# Patient Record
Sex: Male | Born: 1955 | Race: White | Hispanic: No | Marital: Married | Smoking: Former smoker
Health system: Southern US, Community
[De-identification: ages and names within clinical notes are randomized; demographics above are authoritative.]

## PROBLEM LIST (undated history)

## (undated) DIAGNOSIS — I4891 Unspecified atrial fibrillation: Secondary | ICD-10-CM

## (undated) DIAGNOSIS — I10 Essential (primary) hypertension: Secondary | ICD-10-CM

## (undated) DIAGNOSIS — M109 Gout, unspecified: Secondary | ICD-10-CM

## (undated) HISTORY — PX: HERNIA REPAIR: SHX51

## (undated) HISTORY — DX: Unspecified atrial fibrillation: I48.91

## (undated) HISTORY — PX: DERMOID CYST  EXCISION: SHX1452

## (undated) HISTORY — PX: TONSILLECTOMY: SUR1361

## (undated) HISTORY — DX: Essential (primary) hypertension: I10

---

## 2006-09-04 ENCOUNTER — Ambulatory Visit: Admission: RE | Admit: 2006-09-04 | Discharge: 2006-09-04 | Payer: Self-pay | Admitting: *Deleted

## 2011-05-31 ENCOUNTER — Other Ambulatory Visit: Payer: Self-pay | Admitting: *Deleted

## 2011-05-31 NOTE — Telephone Encounter (Signed)
Deny refill per pt wants filled by dr little who is regulating his bp

## 2012-03-13 ENCOUNTER — Encounter: Payer: Self-pay | Admitting: *Deleted

## 2012-03-13 DIAGNOSIS — Z9089 Acquired absence of other organs: Secondary | ICD-10-CM | POA: Insufficient documentation

## 2012-07-28 ENCOUNTER — Emergency Department (HOSPITAL_COMMUNITY): Payer: BC Managed Care – PPO

## 2012-07-28 ENCOUNTER — Emergency Department (HOSPITAL_COMMUNITY)
Admission: EM | Admit: 2012-07-28 | Discharge: 2012-07-28 | Disposition: A | Discharge status: Home / Self Care | Payer: BC Managed Care – PPO | Attending: Emergency Medicine | Admitting: Emergency Medicine

## 2012-07-28 ENCOUNTER — Encounter (HOSPITAL_COMMUNITY): Payer: Self-pay | Admitting: *Deleted

## 2012-07-28 DIAGNOSIS — I4891 Unspecified atrial fibrillation: Secondary | ICD-10-CM | POA: Insufficient documentation

## 2012-07-28 DIAGNOSIS — Y998 Other external cause status: Secondary | ICD-10-CM | POA: Insufficient documentation

## 2012-07-28 DIAGNOSIS — S83003A Unspecified subluxation of unspecified patella, initial encounter: Secondary | ICD-10-CM

## 2012-07-28 DIAGNOSIS — W19XXXA Unspecified fall, initial encounter: Secondary | ICD-10-CM | POA: Insufficient documentation

## 2012-07-28 DIAGNOSIS — S86819A Strain of other muscle(s) and tendon(s) at lower leg level, unspecified leg, initial encounter: Secondary | ICD-10-CM

## 2012-07-28 DIAGNOSIS — M2390 Unspecified internal derangement of unspecified knee: Secondary | ICD-10-CM | POA: Insufficient documentation

## 2012-07-28 DIAGNOSIS — S83006A Unspecified dislocation of unspecified patella, initial encounter: Secondary | ICD-10-CM | POA: Insufficient documentation

## 2012-07-28 DIAGNOSIS — I10 Essential (primary) hypertension: Secondary | ICD-10-CM | POA: Insufficient documentation

## 2012-07-28 DIAGNOSIS — Y92009 Unspecified place in unspecified non-institutional (private) residence as the place of occurrence of the external cause: Secondary | ICD-10-CM | POA: Insufficient documentation

## 2012-07-28 DIAGNOSIS — Y9301 Activity, walking, marching and hiking: Secondary | ICD-10-CM | POA: Insufficient documentation

## 2012-07-28 DIAGNOSIS — Z87891 Personal history of nicotine dependence: Secondary | ICD-10-CM | POA: Insufficient documentation

## 2012-07-28 MED ORDER — IBUPROFEN 800 MG PO TABS
800.0000 mg | ORAL_TABLET | Freq: Once | ORAL | Status: AC
  Administered 2012-07-28: 800 mg via ORAL
  Filled 2012-07-28: qty 1

## 2012-07-28 NOTE — ED Notes (Signed)
Patient transported to X-ray 

## 2012-07-28 NOTE — ED Notes (Signed)
Pt states he slipped and fell and injured R knee at 1500. Pt states he is unable to bear wt. States knee "gives out" when he tries to stand on it. Pt arrives with ice pack on knee. Pt in wheelchair to treatment room.

## 2012-07-28 NOTE — ED Notes (Signed)
Ortho tech at bedside for application of knee immobilizer and crutches fitting.

## 2012-07-28 NOTE — ED Provider Notes (Signed)
History     CSN: [id number]  Arrival date & time 07/28/12  1608   First MD Initiated Contact with Patient 07/28/12 1709      Chief Complaint  Patient presents with  . Knee Injury    rt     (Consider location/radiation/quality/duration/timing/severity/associated sxs/prior treatment) HPI Comments: 56 year old male presents to the emergency department with right knee pain after walking through his yard and falling onto his right knee around 3:00 this afternoon. He states he was trying to avoid walking into a spiderweb when his knees gave out and he fell. Denies hitting his head or any loss of consciousness. He came directly to the emergency department. He states his pain is not bad at rest, however with movement or trying to walk it as a 7/10. He has not tried any alleviating factors. Denies any numbness or tingling down his leg.  The history is provided by the patient.    Past Medical History  Diagnosis Date  . Atrial fibrillation   . Hypertension   . Hx of tonsillectomy     Past Surgical History  Procedure Date  . Hernia repair     Family History  Problem Relation Age of Onset  . Heart disease Father   . Hypertension Father     History  Substance Use Topics  . Smoking status: Former Games developer  . Smokeless tobacco: Not on file  . Alcohol Use: Yes      Review of Systems  Musculoskeletal: Positive for joint swelling, arthralgias (right knee pain) and gait problem.  Skin: Negative for color change and wound.  Neurological: Negative for numbness.  Psychiatric/Behavioral: Negative for confusion.    Allergies  Review of patient's allergies indicates no known allergies.  Home Medications   Current Outpatient Rx  Name Route Sig Dispense Refill  . ALLOPURINOL 300 MG PO TABS Oral Take 300 mg by mouth daily.    Marland Kitchen AMLODIPINE BESYLATE 10 MG PO TABS Oral Take 10 mg by mouth daily.    . ASPIRIN 325 MG PO TABS Oral Take 325 mg by mouth daily.    Marland Kitchen  LISINOPRIL-HYDROCHLOROTHIAZIDE 20-25 MG PO TABS Oral Take 1 tablet by mouth daily.    Marland Kitchen METOPROLOL SUCCINATE ER 25 MG PO TB24 Oral Take 25 mg by mouth daily.    Marland Kitchen PROPAFENONE HCL 225 MG PO TABS Oral Take 225 mg by mouth every 8 (eight) hours.      BP 140/82  Pulse 84  Temp 98.2 F (36.8 C) (Oral)  Resp 23  SpO2 99%  Physical Exam  Nursing note and vitals reviewed. Constitutional: He is oriented to person, place, and time. He appears well-developed and well-nourished. No distress.  HENT:  Head: Normocephalic and atraumatic.  Eyes: Conjunctivae normal are normal.  Neck: Normal range of motion.  Cardiovascular: Normal rate, regular rhythm, normal heart sounds and intact distal pulses.   Pulmonary/Chest: Effort normal and breath sounds normal.  Musculoskeletal:       Right knee: He exhibits decreased range of motion, swelling, effusion, deformity (patella alta) and abnormal patellar mobility. tenderness found.       Obvious disruption of patella tendon. Unable to extend knee on his own.  Neurological: He is alert and oriented to person, place, and time. No sensory deficit.  Skin: Skin is warm, dry and intact.  Psychiatric: He has a normal mood and affect. His behavior is normal.    ED Course  Procedures (including critical care time)  Labs Reviewed - No data to  display Dg Knee Complete 4 Views Right  07/28/2012  *RADIOLOGY REPORT*  Clinical Data: Knee injury  RIGHT KNEE - COMPLETE 4+ VIEW  Comparison: None.  Findings: Four views of the right knee submitted.  No acute fracture.  There is superolateral position of the patella consistent with superolateral subluxation.  Patellar tendon injury cannot be excluded.  Clinical correlation is necessary.  IMPRESSION: No acute fracture.  There is superolateral position of the patella consistent with superolateral subluxation.  Patellar tendon injury cannot be excluded.  Clinical correlation is necessary.   Original Report Authenticated By: Natasha Mead, M.D.      1. Patellar subluxation   2. 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 Spoke with Dr. Ophelia Charter who advised to apply a knee immobilizer and give crutches, and he will see the patient in his office on Tuesday. Case discussed with Dr. Effie Shy who also evaluated the patient and agrees with plan of care.        Trevor Mace, PA-C 07/28/12 [phone]

## 2012-07-30 NOTE — ED Provider Notes (Signed)
Pt seen and evaluated. He cannot extend the knee. Moderate anterior swelling over patellar insertion on tibia site.   Medical screening examination/treatment/procedure(s) were conducted as a shared visit with non-physician practitioner(s) and myself.  I personally evaluated the patient during the encounter  Flint Melter, MD 07/30/12 [phone]

## 2012-07-31 ENCOUNTER — Other Ambulatory Visit (HOSPITAL_COMMUNITY): Payer: Self-pay | Admitting: Orthopaedic Surgery

## 2012-07-31 ENCOUNTER — Encounter (HOSPITAL_COMMUNITY)
Admission: RE | Admit: 2012-07-31 | Discharge: 2012-07-31 | Disposition: A | Discharge status: Home / Self Care | Payer: BC Managed Care – PPO | Source: Ambulatory Visit | Attending: Orthopaedic Surgery | Admitting: Orthopaedic Surgery

## 2012-07-31 ENCOUNTER — Encounter (HOSPITAL_COMMUNITY): Payer: Self-pay

## 2012-07-31 ENCOUNTER — Ambulatory Visit (HOSPITAL_COMMUNITY)
Admission: RE | Admit: 2012-07-31 | Discharge: 2012-07-31 | Disposition: A | Discharge status: Home / Self Care | Payer: BC Managed Care – PPO | Source: Ambulatory Visit | Attending: Orthopaedic Surgery | Admitting: Orthopaedic Surgery

## 2012-07-31 DIAGNOSIS — Z01812 Encounter for preprocedural laboratory examination: Secondary | ICD-10-CM | POA: Insufficient documentation

## 2012-07-31 DIAGNOSIS — Z01810 Encounter for preprocedural cardiovascular examination: Secondary | ICD-10-CM | POA: Insufficient documentation

## 2012-07-31 DIAGNOSIS — Z01818 Encounter for other preprocedural examination: Secondary | ICD-10-CM | POA: Insufficient documentation

## 2012-07-31 HISTORY — DX: Gout, unspecified: M10.9

## 2012-07-31 LAB — COMPREHENSIVE METABOLIC PANEL
Albumin: 4 g/dL (ref 3.5–5.2)
BUN: 22 mg/dL (ref 6–23)
CO2: 29 mEq/L (ref 19–32)
Chloride: 101 mEq/L (ref 96–112)
Creatinine, Ser: 1.33 mg/dL (ref 0.50–1.35)
GFR calc Af Amer: 67 mL/min — ABNORMAL LOW (ref >90)
GFR calc non Af Amer: 58 mL/min — ABNORMAL LOW (ref >90)
Glucose, Bld: 137 mg/dL — ABNORMAL HIGH (ref 70–99)
Total Bilirubin: 0.6 mg/dL (ref 0.3–1.2)

## 2012-07-31 LAB — CBC
HCT: 49.2 % (ref 39.0–52.0)
Hemoglobin: 17.4 g/dL — ABNORMAL HIGH (ref 13.0–17.0)
MCV: 95.7 fL (ref 78.0–100.0)
RDW: 12.7 % (ref 11.5–15.5)
WBC: 7.3 10*3/uL (ref 4.0–10.5)

## 2012-07-31 LAB — PROTIME-INR
INR: 0.93 (ref 0.00–1.49)
Prothrombin Time: 12.4 seconds (ref 11.6–15.2)

## 2012-07-31 NOTE — Progress Notes (Addendum)
Requested cardiac records from [facility] and [facility]. Pt dx with A. Fib 3-4 years ago and had workup, saw doctor at [facility], however now does not see cardiologist. Has continued to take metoprolol as prescribed.   Will leave for review by anesthesia.

## 2012-07-31 NOTE — Pre-Procedure Instructions (Signed)
20 Victor Young  07/31/2012   Your procedure is scheduled on:  Monday September 30  Report to [facility] at 8:30 AM.  Call this number if you have problems the morning of surgery: [phone]   Remember:   Do not eat or drink:After Midnight.    Take these medicines the morning of surgery with A SIP OF WATER: Allopurinol (Zyloprim), amlodipine (Norvasc), metoprolol (Toprol), propafenone (Rythmol)   Do not wear jewelry, make-up or nail polish.  Do not wear lotions, powders, or perfumes. You may wear deodorant.  Do not shave 48 hours prior to surgery. Men may shave face and neck.  Do not bring valuables to the hospital.  Contacts, dentures or bridgework may not be worn into surgery.  Leave suitcase in the car. After surgery it may be brought to your room.  For patients admitted to the hospital, checkout time is 11:00 AM the day of discharge.   Patients discharged the day of surgery will not be allowed to drive home.  Name and phone number of your driver: NA  Special Instructions: Shower using CHG 2 nights before surgery and the night before surgery.  If you shower the day of surgery use CHG.  Use special wash - you have one bottle of CHG for all showers.  You should use approximately 1/3 of the bottle for each shower.   Please read over the following fact sheets that you were given: Pain Booklet, Coughing and Deep Breathing and Surgical Site Infection Prevention

## 2012-08-01 NOTE — Consult Note (Signed)
Anesthesia chart review: Patient is a 56 year old male scheduled for right patellar tendon repair on 08/05/2012 by Dr. Ophelia Charter. History includes atrial fibrillation X ~ 4 years, HTN, former smoker, gout, tonsillectomy, hernia repair.  No specified PCP is listed.  He reports he was initially evaluated by a Cardiologist at [facility] (with reported negative stress test) and then by Dr. Lady Deutscher formerly of [facility], but it has been > 3 years ago.  He reports that he had an unsuccessful cardioversion.  He is maintained on B-blocker therapy and ASA.  Currently, his afib is followed by Dr. Catha Gosselin [facility]).  Labs noted.  CXR on 07/31/12 showed no acute cardiopulmonary abnormalities.  EKG on 07/31/12 showed afib @ 92 bpm (new since 09/04/06).  He denies history of chest pain or CHF.  He denies history of cardiac cath and does not remember ever having an echocardiogram.  No cardiac records from [facility] or [facility] (now [facility]) are readily accessible.  I requested any cardiac records from his PCP Dr. Clarene Duke as available.  His afib is chronic and currently rate controlled.  He reports a history of a normal stress test > 3 years ago following his diagnosis of a-fib.  If no significant change in his status then anticipate he can proceed as planned.  Anesthesiologist Dr. Ivin Booty agrees with this plan.  Shonna Chock, PA-C

## 2012-08-04 MED ORDER — CEFAZOLIN SODIUM-DEXTROSE 2-3 GM-% IV SOLR
2.0000 g | INTRAVENOUS | Status: AC
  Administered 2012-08-05: 2 g via INTRAVENOUS

## 2012-08-05 ENCOUNTER — Inpatient Hospital Stay (HOSPITAL_COMMUNITY)
Admission: RE | Admit: 2012-08-05 | Discharge: 2012-08-07 | DRG: 222 | Disposition: A | Discharge status: Home / Self Care | Payer: BC Managed Care – PPO | Source: Ambulatory Visit | Attending: Orthopaedic Surgery | Admitting: Orthopaedic Surgery

## 2012-08-05 ENCOUNTER — Ambulatory Visit (HOSPITAL_COMMUNITY): Payer: BC Managed Care – PPO | Admitting: Vascular Surgery

## 2012-08-05 ENCOUNTER — Encounter (HOSPITAL_COMMUNITY): Payer: Self-pay | Admitting: *Deleted

## 2012-08-05 ENCOUNTER — Encounter (HOSPITAL_COMMUNITY): Payer: Self-pay | Admitting: Vascular Surgery

## 2012-08-05 ENCOUNTER — Encounter (HOSPITAL_COMMUNITY)
Admission: RE | Disposition: A | Discharge status: Home / Self Care | Payer: Self-pay | Source: Ambulatory Visit | Attending: Orthopaedic Surgery

## 2012-08-05 DIAGNOSIS — S838X9A Sprain of other specified parts of unspecified knee, initial encounter: Principal | ICD-10-CM | POA: Diagnosis present

## 2012-08-05 DIAGNOSIS — S86819A Strain of other muscle(s) and tendon(s) at lower leg level, unspecified leg, initial encounter: Secondary | ICD-10-CM | POA: Diagnosis present

## 2012-08-05 DIAGNOSIS — Z79899 Other long term (current) drug therapy: Secondary | ICD-10-CM

## 2012-08-05 DIAGNOSIS — Z23 Encounter for immunization: Secondary | ICD-10-CM

## 2012-08-05 DIAGNOSIS — M25469 Effusion, unspecified knee: Secondary | ICD-10-CM | POA: Diagnosis present

## 2012-08-05 DIAGNOSIS — M109 Gout, unspecified: Secondary | ICD-10-CM | POA: Diagnosis present

## 2012-08-05 DIAGNOSIS — Y929 Unspecified place or not applicable: Secondary | ICD-10-CM

## 2012-08-05 DIAGNOSIS — W19XXXA Unspecified fall, initial encounter: Secondary | ICD-10-CM | POA: Diagnosis present

## 2012-08-05 DIAGNOSIS — Z7982 Long term (current) use of aspirin: Secondary | ICD-10-CM

## 2012-08-05 DIAGNOSIS — Z9089 Acquired absence of other organs: Secondary | ICD-10-CM

## 2012-08-05 DIAGNOSIS — I10 Essential (primary) hypertension: Secondary | ICD-10-CM | POA: Diagnosis present

## 2012-08-05 DIAGNOSIS — I4891 Unspecified atrial fibrillation: Secondary | ICD-10-CM | POA: Diagnosis present

## 2012-08-05 DIAGNOSIS — Z87891 Personal history of nicotine dependence: Secondary | ICD-10-CM

## 2012-08-05 HISTORY — PX: PATELLAR TENDON REPAIR: SHX737

## 2012-08-05 SURGERY — REPAIR, TENDON, PATELLAR
Anesthesia: General | Site: Leg Lower | Laterality: Right | Wound class: Clean

## 2012-08-05 MED ORDER — ONDANSETRON HCL 4 MG PO TABS: 4.0000 mg | ORAL_TABLET | Freq: Four times a day (QID) | ORAL | Status: DC | PRN

## 2012-08-05 MED ORDER — 0.9 % SODIUM CHLORIDE (POUR BTL) OPTIME
TOPICAL | Status: DC | PRN
  Administered 2012-08-05: 1000 mL

## 2012-08-05 MED ORDER — LACTATED RINGERS IV SOLN
INTRAVENOUS | Status: DC
  Administered 2012-08-05: 11:00:00 via INTRAVENOUS

## 2012-08-05 MED ORDER — LISINOPRIL 20 MG PO TABS
20.0000 mg | ORAL_TABLET | Freq: Every day | ORAL | Status: DC
  Administered 2012-08-06 – 2012-08-07 (×2): 20 mg via ORAL
  Filled 2012-08-05 (×2): qty 1

## 2012-08-05 MED ORDER — MIDAZOLAM HCL 2 MG/2ML IJ SOLN
1.0000 mg | INTRAMUSCULAR | Status: DC | PRN
  Administered 2012-08-05: 2 mg via INTRAVENOUS

## 2012-08-05 MED ORDER — ALLOPURINOL 300 MG PO TABS
300.0000 mg | ORAL_TABLET | Freq: Every day | ORAL | Status: DC
  Administered 2012-08-05 – 2012-08-07 (×3): 300 mg via ORAL
  Filled 2012-08-05 (×3): qty 1

## 2012-08-05 MED ORDER — PHENYLEPHRINE HCL 10 MG/ML IJ SOLN
INTRAMUSCULAR | Status: DC | PRN
  Administered 2012-08-05 (×2): 80 ug via INTRAVENOUS

## 2012-08-05 MED ORDER — SENNOSIDES-DOCUSATE SODIUM 8.6-50 MG PO TABS: 1.0000 | ORAL_TABLET | Freq: Every evening | ORAL | Status: DC | PRN

## 2012-08-05 MED ORDER — LIDOCAINE HCL (CARDIAC) 20 MG/ML IV SOLN
INTRAVENOUS | Status: DC | PRN
  Administered 2012-08-05: 30 mg via INTRAVENOUS

## 2012-08-05 MED ORDER — MORPHINE SULFATE 2 MG/ML IJ SOLN
INTRAMUSCULAR | Status: AC
  Filled 2012-08-05: qty 1

## 2012-08-05 MED ORDER — OXYCODONE-ACETAMINOPHEN 5-325 MG PO TABS
1.0000 | ORAL_TABLET | ORAL | Status: DC | PRN
  Administered 2012-08-06 – 2012-08-07 (×7): 2 via ORAL
  Filled 2012-08-05 (×7): qty 2

## 2012-08-05 MED ORDER — FENTANYL CITRATE 0.05 MG/ML IJ SOLN: 50.0000 ug | Freq: Once | INTRAMUSCULAR | Status: DC

## 2012-08-05 MED ORDER — METOPROLOL SUCCINATE ER 25 MG PO TB24
25.0000 mg | ORAL_TABLET | Freq: Every day | ORAL | Status: DC
  Administered 2012-08-06 – 2012-08-07 (×2): 25 mg via ORAL
  Filled 2012-08-05 (×2): qty 1

## 2012-08-05 MED ORDER — ZOLPIDEM TARTRATE 5 MG PO TABS: 5.0000 mg | ORAL_TABLET | Freq: Every evening | ORAL | Status: DC | PRN

## 2012-08-05 MED ORDER — FENTANYL CITRATE 0.05 MG/ML IJ SOLN
INTRAMUSCULAR | Status: AC
  Filled 2012-08-05: qty 2

## 2012-08-05 MED ORDER — KCL IN DEXTROSE-NACL 20-5-0.45 MEQ/L-%-% IV SOLN
INTRAVENOUS | Status: DC
  Administered 2012-08-05 – 2012-08-06 (×3): via INTRAVENOUS
  Filled 2012-08-05 (×5): qty 1000

## 2012-08-05 MED ORDER — ONDANSETRON HCL 4 MG/2ML IJ SOLN
INTRAMUSCULAR | Status: DC | PRN
  Administered 2012-08-05: 4 mg via INTRAVENOUS

## 2012-08-05 MED ORDER — AMLODIPINE BESYLATE 10 MG PO TABS
10.0000 mg | ORAL_TABLET | Freq: Every day | ORAL | Status: DC
  Administered 2012-08-06 – 2012-08-07 (×2): 10 mg via ORAL
  Filled 2012-08-05 (×2): qty 1

## 2012-08-05 MED ORDER — MIDAZOLAM HCL 5 MG/5ML IJ SOLN
INTRAMUSCULAR | Status: DC | PRN
  Administered 2012-08-05: 2 mg via INTRAVENOUS

## 2012-08-05 MED ORDER — CEFAZOLIN SODIUM-DEXTROSE 2-3 GM-% IV SOLR
2.0000 g | Freq: Four times a day (QID) | INTRAVENOUS | Status: AC
  Administered 2012-08-05 – 2012-08-06 (×3): 2 g via INTRAVENOUS
  Filled 2012-08-05 (×3): qty 50

## 2012-08-05 MED ORDER — MIDAZOLAM HCL 2 MG/2ML IJ SOLN
INTRAMUSCULAR | Status: AC
  Filled 2012-08-05: qty 2

## 2012-08-05 MED ORDER — DEXTROSE 5 % IV SOLN: 500.0000 mg | Freq: Four times a day (QID) | INTRAVENOUS | Status: DC | PRN

## 2012-08-05 MED ORDER — ACETAMINOPHEN 10 MG/ML IV SOLN
INTRAVENOUS | Status: DC | PRN
  Administered 2012-08-05: 1000 mg via INTRAVENOUS

## 2012-08-05 MED ORDER — ASPIRIN 325 MG PO TABS
325.0000 mg | ORAL_TABLET | Freq: Every day | ORAL | Status: DC
  Administered 2012-08-05 – 2012-08-07 (×3): 325 mg via ORAL
  Filled 2012-08-05 (×3): qty 1

## 2012-08-05 MED ORDER — METHOCARBAMOL 500 MG PO TABS
500.0000 mg | ORAL_TABLET | Freq: Four times a day (QID) | ORAL | Status: DC | PRN
  Administered 2012-08-06 – 2012-08-07 (×4): 500 mg via ORAL
  Filled 2012-08-05 (×4): qty 1

## 2012-08-05 MED ORDER — PROPOFOL 10 MG/ML IV BOLUS
INTRAVENOUS | Status: DC | PRN
  Administered 2012-08-05: 200 mg via INTRAVENOUS

## 2012-08-05 MED ORDER — CEFAZOLIN SODIUM-DEXTROSE 2-3 GM-% IV SOLR
INTRAVENOUS | Status: AC
  Filled 2012-08-05: qty 50

## 2012-08-05 MED ORDER — METOCLOPRAMIDE HCL 5 MG/ML IJ SOLN: 5.0000 mg | Freq: Three times a day (TID) | INTRAMUSCULAR | Status: DC | PRN

## 2012-08-05 MED ORDER — ACETAMINOPHEN 10 MG/ML IV SOLN
INTRAVENOUS | Status: AC
  Filled 2012-08-05: qty 100

## 2012-08-05 MED ORDER — HYDROCODONE-ACETAMINOPHEN 5-325 MG PO TABS
1.0000 | ORAL_TABLET | ORAL | Status: DC | PRN
Start: 2012-08-05 — End: 2012-08-07
  Administered 2012-08-06 (×2): 2 via ORAL
  Filled 2012-08-05 (×2): qty 2

## 2012-08-05 MED ORDER — FENTANYL CITRATE 0.05 MG/ML IJ SOLN
100.0000 ug | Freq: Once | INTRAMUSCULAR | Status: AC
  Administered 2012-08-05: 100 ug via INTRAVENOUS

## 2012-08-05 MED ORDER — BUPIVACAINE HCL (PF) 0.25 % IJ SOLN
INTRAMUSCULAR | Status: AC
  Filled 2012-08-05: qty 30

## 2012-08-05 MED ORDER — BUPIVACAINE HCL (PF) 0.25 % IJ SOLN
INTRAMUSCULAR | Status: DC | PRN
  Administered 2012-08-05: 10 mL

## 2012-08-05 MED ORDER — LACTATED RINGERS IV SOLN
INTRAVENOUS | Status: DC | PRN
  Administered 2012-08-05 (×2): via INTRAVENOUS

## 2012-08-05 MED ORDER — ONDANSETRON HCL 4 MG/2ML IJ SOLN: 4.0000 mg | Freq: Four times a day (QID) | INTRAMUSCULAR | Status: DC | PRN

## 2012-08-05 MED ORDER — LISINOPRIL-HYDROCHLOROTHIAZIDE 20-25 MG PO TABS: 1.0000 | ORAL_TABLET | Freq: Every day | ORAL | Status: DC

## 2012-08-05 MED ORDER — METOCLOPRAMIDE HCL 10 MG PO TABS: 5.0000 mg | ORAL_TABLET | Freq: Three times a day (TID) | ORAL | Status: DC | PRN

## 2012-08-05 MED ORDER — FENTANYL CITRATE 0.05 MG/ML IJ SOLN
INTRAMUSCULAR | Status: DC | PRN
  Administered 2012-08-05: 50 ug via INTRAVENOUS
  Administered 2012-08-05: 100 ug via INTRAVENOUS
  Administered 2012-08-05 (×2): 50 ug via INTRAVENOUS

## 2012-08-05 MED ORDER — HYDROCHLOROTHIAZIDE 25 MG PO TABS
25.0000 mg | ORAL_TABLET | Freq: Every day | ORAL | Status: DC
  Administered 2012-08-06 – 2012-08-07 (×2): 25 mg via ORAL
  Filled 2012-08-05 (×2): qty 1

## 2012-08-05 MED ORDER — DOCUSATE SODIUM 100 MG PO CAPS
100.0000 mg | ORAL_CAPSULE | Freq: Two times a day (BID) | ORAL | Status: DC
  Administered 2012-08-05 – 2012-08-07 (×4): 100 mg via ORAL
  Filled 2012-08-05 (×4): qty 1

## 2012-08-05 MED ORDER — MORPHINE SULFATE 2 MG/ML IJ SOLN
1.0000 mg | INTRAMUSCULAR | Status: DC | PRN
  Administered 2012-08-05: 2 mg via INTRAVENOUS

## 2012-08-05 MED ORDER — BISACODYL 10 MG RE SUPP: 10.0000 mg | Freq: Every day | RECTAL | Status: DC | PRN

## 2012-08-05 SURGICAL SUPPLY — 60 items
ADH SKN CLS APL DERMABOND .7 (GAUZE/BANDAGES/DRESSINGS) ×1
BANDAGE ELASTIC 4 VELCRO ST LF (GAUZE/BANDAGES/DRESSINGS) ×1 IMPLANT
BANDAGE ELASTIC 6 VELCRO ST LF (GAUZE/BANDAGES/DRESSINGS) ×1 IMPLANT
BLADE SURG ROTATE 9660 (MISCELLANEOUS) ×2 IMPLANT
BNDG COHESIVE 4X5 TAN STRL (GAUZE/BANDAGES/DRESSINGS) ×2 IMPLANT
CANISTER SUCTION 2500CC (MISCELLANEOUS) ×1 IMPLANT
CLOTH BEACON ORANGE TIMEOUT ST (SAFETY) ×2 IMPLANT
COVER SURGICAL LIGHT HANDLE (MISCELLANEOUS) ×2 IMPLANT
CUFF TOURNIQUET SINGLE 34IN LL (TOURNIQUET CUFF) ×1 IMPLANT
CUFF TOURNIQUET SINGLE 44IN (TOURNIQUET CUFF) IMPLANT
DERMABOND ADVANCED (GAUZE/BANDAGES/DRESSINGS) ×1
DERMABOND ADVANCED .7 DNX12 (GAUZE/BANDAGES/DRESSINGS) IMPLANT
DRSG ADAPTIC 3X8 NADH LF (GAUZE/BANDAGES/DRESSINGS) ×1 IMPLANT
DRSG EMULSION OIL 3X3 NADH (GAUZE/BANDAGES/DRESSINGS) ×1 IMPLANT
DRSG PAD ABDOMINAL 8X10 ST (GAUZE/BANDAGES/DRESSINGS) ×1 IMPLANT
ELECT REM PT RETURN 9FT ADLT (ELECTROSURGICAL) ×2
ELECTRODE REM PT RTRN 9FT ADLT (ELECTROSURGICAL) ×1 IMPLANT
GLOVE BIOGEL PI IND STRL 7.0 (GLOVE) IMPLANT
GLOVE BIOGEL PI IND STRL 7.5 (GLOVE) ×1 IMPLANT
GLOVE BIOGEL PI IND STRL 8 (GLOVE) ×1 IMPLANT
GLOVE BIOGEL PI INDICATOR 7.0 (GLOVE) ×1
GLOVE BIOGEL PI INDICATOR 7.5 (GLOVE) ×1
GLOVE BIOGEL PI INDICATOR 8 (GLOVE) ×1
GLOVE ECLIPSE 7.0 STRL STRAW (GLOVE) ×2 IMPLANT
GLOVE ORTHO TXT STRL SZ7.5 (GLOVE) ×2 IMPLANT
GLOVE SURG SS PI 6.5 STRL IVOR (GLOVE) ×1 IMPLANT
GOWN PREVENTION PLUS LG XLONG (DISPOSABLE) IMPLANT
GOWN PREVENTION PLUS XLARGE (GOWN DISPOSABLE) ×6 IMPLANT
KIT BASIN OR (CUSTOM PROCEDURE TRAY) ×2 IMPLANT
KIT ROOM TURNOVER OR (KITS) ×2 IMPLANT
MANIFOLD NEPTUNE II (INSTRUMENTS) ×1 IMPLANT
NDL KEITH (NEEDLE) IMPLANT
NEEDLE 22X1 1/2 (OR ONLY) (NEEDLE) ×1 IMPLANT
NEEDLE KEITH (NEEDLE) ×4 IMPLANT
NS IRRIG 1000ML POUR BTL (IV SOLUTION) ×2 IMPLANT
PACK ORTHO EXTREMITY (CUSTOM PROCEDURE TRAY) ×2 IMPLANT
PAD ARMBOARD 7.5X6 YLW CONV (MISCELLANEOUS) ×4 IMPLANT
PAD CAST 4YDX4 CTTN HI CHSV (CAST SUPPLIES) ×2 IMPLANT
PADDING CAST COTTON 4X4 STRL (CAST SUPPLIES) ×2
PADDING CAST COTTON 6X4 STRL (CAST SUPPLIES) ×2 IMPLANT
PASSER SUT SWANSON 36MM LOOP (INSTRUMENTS) ×1 IMPLANT
SPONGE GAUZE 4X4 12PLY (GAUZE/BANDAGES/DRESSINGS) ×1 IMPLANT
SPONGE LAP 4X18 X RAY DECT (DISPOSABLE) ×4 IMPLANT
STAPLER VISISTAT 35W (STAPLE) ×2 IMPLANT
STOCKINETTE IMPERVIOUS 9X36 MD (GAUZE/BANDAGES/DRESSINGS) ×2 IMPLANT
SUCTION FRAZIER TIP 10 FR DISP (SUCTIONS) ×2 IMPLANT
SUT ETHIBOND NAB CT1 #1 30IN (SUTURE) ×2 IMPLANT
SUT FIBERWIRE #2 38 REV NDL BL (SUTURE) ×10
SUT VIC AB 0 CT1 27 (SUTURE) ×2
SUT VIC AB 0 CT1 27XBRD ANBCTR (SUTURE) ×1 IMPLANT
SUT VIC AB 2-0 CT1 27 (SUTURE) ×4
SUT VIC AB 2-0 CT1 TAPERPNT 27 (SUTURE) ×2 IMPLANT
SUTURE FIBERWR#2 38 REV NDL BL (SUTURE) IMPLANT
SYR CONTROL 10ML LL (SYRINGE) ×1 IMPLANT
TOWEL OR 17X24 6PK STRL BLUE (TOWEL DISPOSABLE) ×2 IMPLANT
TOWEL OR 17X26 10 PK STRL BLUE (TOWEL DISPOSABLE) ×2 IMPLANT
TUBE CONNECTING 12X1/4 (SUCTIONS) ×2 IMPLANT
UNDERPAD 30X30 INCONTINENT (UNDERPADS AND DIAPERS) ×2 IMPLANT
WATER STERILE IRR 1000ML POUR (IV SOLUTION) ×1 IMPLANT
YANKAUER SUCT BULB TIP NO VENT (SUCTIONS) ×1 IMPLANT

## 2012-08-05 NOTE — Anesthesia Preprocedure Evaluation (Signed)
Anesthesia Evaluation  Patient identified by MRN, date of birth, ID band  Reviewed: Allergy & Precautions, H&P , NPO status , Patient's Chart, lab work & pertinent test results, reviewed documented beta blocker date and time   Airway Mallampati: I      Dental  (+) Caps and Dental Advidsory Given   Pulmonary          Cardiovascular hypertension, Pt. on home beta blockers + dysrhythmias Atrial Fibrillation Rhythm:irregular     Neuro/Psych negative neurological ROS     GI/Hepatic negative GI ROS, Neg liver ROS,   Endo/Other  negative endocrine ROS  Renal/GU negative Renal ROS  negative genitourinary   Musculoskeletal negative musculoskeletal ROS (+)   Abdominal   Peds  Hematology negative hematology ROS (+)   Anesthesia Other Findings   Reproductive/Obstetrics negative OB ROS                           Anesthesia Physical Anesthesia Plan  ASA: II  Anesthesia Plan:    Post-op Pain Management:    Induction:   Airway Management Planned:   Additional Equipment:   Intra-op Plan:   Post-operative Plan:   Informed Consent:   Plan Discussed with:   Anesthesia Plan Comments:         Anesthesia Quick Evaluation

## 2012-08-05 NOTE — Interval H&P Note (Signed)
History and Physical Interval Note:  08/05/2012 1:05 PM  Victor Young  has presented today for surgery, with the diagnosis of Right Patellar Tendon Rupture  The various methods of treatment have been discussed with the patient and family. After consideration of risks, benefits and other options for treatment, the patient has consented to  Procedure(s) (LRB) with comments: PATELLA TENDON REPAIR (Right) - Right Patella Tendon Repair as a surgical intervention .  The patient's history has been reviewed, patient examined, no change in status, stable for surgery.  I have reviewed the patient's chart and labs.  Questions were answered to the patient's satisfaction.     YATES,MARK C

## 2012-08-05 NOTE — Anesthesia Postprocedure Evaluation (Signed)
  Anesthesia Post-op Note  Patient: Victor Young  Procedure(s) Performed: Procedure(s) (LRB) with comments: PATELLA TENDON REPAIR (Right) - Right Patella Tendon Repair  Patient Location: PACU  Anesthesia Type: General  Level of Consciousness: awake, alert  and oriented  Airway and Oxygen Therapy: Patient Spontanous Breathing and Patient connected to nasal cannula oxygen  Post-op Pain: mild  Post-op Assessment: Post-op Vital signs reviewed and Patient's Cardiovascular Status Stable  Post-op Vital Signs: stable  Complications: No apparent anesthesia complications

## 2012-08-05 NOTE — Brief Op Note (Cosign Needed)
08/05/2012  12:55 PM  PATIENT:  Victor Young  56 y.o. male  PRE-OPERATIVE DIAGNOSIS:  Right Patellar Tendon Rupture  POST-OPERATIVE DIAGNOSIS:  Right Patellar Tendon Rupture  PROCEDURE:  Procedure(s) (LRB) with comments: PATELLA TENDON REPAIR (Right) - Right Patella Tendon Repair  SURGEON:  Surgeon(s) and Role:    * Eldred Manges, MD - Primary  PHYSICIAN ASSISTANT: none  ASSISTANTS: none   ANESTHESIA:   general  EBL:  Total I/O In: 1200 [I.V.:1200] Out: -   BLOOD ADMINISTERED:none  DRAINS: none   LOCAL MEDICATIONS USED:  NONE  SPECIMEN:  No Specimen  DISPOSITION OF SPECIMEN:  N/A  COUNTS:  YES  TOURNIQUET:   Total Tourniquet Time Documented: Thigh (Right) - 55 minutes  DICTATION: .Note written in EPIC  PLAN OF CARE: Admit to inpatient   PATIENT DISPOSITION:  PACU - hemodynamically stable.   Delay start of Pharmacological VTE agent (>24hrs) due to surgical blood loss or risk of bleeding: yes

## 2012-08-05 NOTE — Transfer of Care (Signed)
Immediate Anesthesia Transfer of Care Note  Patient: Victor Young  Procedure(s) Performed: Procedure(s) (LRB) with comments: PATELLA TENDON REPAIR (Right) - Right Patella Tendon Repair  Patient Location: PACU  Anesthesia Type: General and Regional  Level of Consciousness: awake, alert , oriented and sedated  Airway & Oxygen Therapy: Patient Spontanous Breathing and Patient connected to nasal cannula oxygen  Post-op Assessment: Report given to PACU RN, Post -op Vital signs reviewed and stable and Patient moving all extremities  Post vital signs: Reviewed and stable  Complications: No apparent anesthesia complications

## 2012-08-05 NOTE — Preoperative (Signed)
Beta Blockers   Reason not to administer Beta Blockers:Not Applicable 

## 2012-08-05 NOTE — H&P (Signed)
Victor Young is an 56 y.o. male.   Chief Complaint: right knee injury HPI: Pt fell in his yard on 9/22 and landed on his right knee.  Difficulty walking and moving knee.  He presented to the ED where he was diagnosed with patella tendon rupture of right knee.  Pt seen in office by DR Victor Young and assessed.  Radiographs with no fracture of right knee and patella alta noted.  Scheduled for repair of right patella tendon.  Past Medical History  Diagnosis Date  . Atrial fibrillation   . Hypertension   . Hx of tonsillectomy   . Gout     Past Surgical History  Procedure Date  . Hernia repair   . Dermoid cyst  excision   . Tonsillectomy     Family History  Problem Relation Age of Onset  . Heart disease Father   . Hypertension Father    Social History:  reports that he has quit smoking. He does not have any smokeless tobacco history on file. He reports that he drinks about 12.6 ounces of alcohol per week. He reports that he does not use illicit drugs.  Allergies: No Known Allergies  Medications Prior to Admission  Medication Sig Dispense Refill  . allopurinol (ZYLOPRIM) 300 MG tablet Take 300 mg by mouth daily.      Marland Kitchen amLODipine (NORVASC) 10 MG tablet Take 10 mg by mouth daily.      Marland Kitchen aspirin 325 MG tablet Take 325 mg by mouth daily.      Marland Kitchen lisinopril-hydrochlorothiazide (PRINZIDE,ZESTORETIC) 20-25 MG per tablet Take 1 tablet by mouth daily.      . metoprolol succinate (TOPROL-XL) 25 MG 24 hr tablet Take 25 mg by mouth daily.        No results found for this or any previous visit (from the past 48 hour(s)). No results found.  Review of Systems  Constitutional: Negative.   HENT: Negative.   Eyes: Negative.   Respiratory: Negative.   Cardiovascular: Negative.   Gastrointestinal: Negative.   Genitourinary: Negative.   Musculoskeletal: Positive for joint pain.  Skin: Negative.   Neurological: Negative.   Endo/Heme/Allergies: Negative.   Psychiatric/Behavioral: Negative.      Blood pressure 138/108, pulse 95, temperature 98 F (36.7 C), temperature source Oral, resp. rate 18, SpO2 98.00%. Physical Exam  Constitutional: He is oriented to person, place, and time. He appears well-developed and well-nourished.  HENT:  Head: Normocephalic and atraumatic.  Eyes: EOM are normal. Pupils are equal, round, and reactive to light.  Neck: Normal range of motion.  Cardiovascular: Normal heart sounds.   Respiratory: Effort normal.  GI: Soft.  Musculoskeletal:       Edema and effusion of right knee.unable to extend knee.  Distal pulses and sensation intact.  Neurological: He is alert and oriented to person, place, and time.  Skin: Skin is warm and dry.  Psychiatric: He has a normal mood and affect.     Assessment/Plan Right patella tendon rupture  PLAN: right patella tendon repair.  Victor Young M 08/05/2012, 9:15 AM

## 2012-08-05 NOTE — Anesthesia Procedure Notes (Signed)
Anesthesia Regional Block:  Femoral nerve block  Pre-Anesthetic Checklist: ,, timeout performed, Correct Patient, Correct Site, Correct Laterality, Correct Procedure, Correct Position, site marked, Risks and benefits discussed,  Surgical consent,  Pre-op evaluation,  At surgeon's request and post-op pain management  Laterality: Right  Prep: Maximum Sterile Barrier Precautions used and chloraprep       Needles:   Needle Type: Echogenic Stimulator Needle     Needle Length:cm 9 cm Needle Gauge: 22 and 22 G    Additional Needles:  Procedures: ultrasound guided Femoral nerve block Narrative:  Start time: 08/05/2012 10:40 AM End time: 08/05/2012 10:50 AM  Performed by: Personally   Additional Notes: 30 cc 0.5% marcaine with 1:200 Epi injected without difficulty.  Kipp Brood, MD  Femoral nerve block

## 2012-08-06 ENCOUNTER — Encounter (HOSPITAL_COMMUNITY): Payer: Self-pay | Admitting: Orthopaedic Surgery

## 2012-08-06 NOTE — Progress Notes (Signed)
Orthopedic Tech Progress Note Patient Details:  CRUZ BONG Nov 05, 1956 [id number] TRAPEZE BAR Patient ID: Harvie Bridge, male   DOB: 10-Jul-1956, 56 y.o.   MRN: [id number] TRAPEZE BAR  Shawnie Pons 08/06/2012, 3:20 PM

## 2012-08-06 NOTE — Op Note (Signed)
NAME:  Victor Young, Victor Young NO.:  [id number]  MEDICAL RECORD NO.:  [id number]  LOCATION:  5N10C                        FACILITY:  [facility]  PHYSICIAN:  Jamarl Pew C. Ophelia Charter, M.D.    DATE OF BIRTH:  Mar 26, 1956  DATE OF PROCEDURE:  08/05/2012 DATE OF DISCHARGE:                              OPERATIVE REPORT   PREOPERATIVE DIAGNOSIS:  Right knee patellar tendon rupture.  POSTOPERATIVE DIAGNOSIS:  Right knee patellar tendon rupture.  PROCEDURE:  Repair of right patellar tendon rupture.  SURGEON:  Ionna Avis C. Ophelia Charter, M.D.  TOURNIQUET TIME:  54 minutes.  ANESTHESIA:  GOT with preoperative femoral nerve block and postop 10 mL Marcaine, 0.25% plain skin local.  COMPLICATIONS:  None.  INDICATIONS:  This is a 56 year old male walked in a spiderweb that got his face, had __________ try to decelerate, at the same time hyperextending his knee, felt a sharp pain and pop, was unable the walk with the patellar tendon rupture off of the inferior pole of the patella.  There is high-riding patella, palpable gap, and inability to extend his knee.  With quadriceps contracture, the patella would migrate proximally with palpable gap and hemarthrosis.  PROCEDURE:  After prepping with DuraPrep with proximal thigh tourniquet, the usual DuraPrep, impervious stockinette, Coban, towel proximally at the level of the tourniquet, towel clip, and extremity sheets and drapes were applied.  Time-out procedure was completed.  Preoperative Ancef was given prophylactically 2 g.  Midline incision was drawn with the skin marker, crosshatches, and Betadine Steri-Drape application.  Leg was elevated, wrapped with an Esmarch, tourniquet was inflated to 350. Midline incision was made.  Hematoma was evacuated out of the joint. The patellar tendon was shredded and had extended tendinopathy present throughout the tendon and looked more likely shredded Achilles rupture than the normal patellar tendon rupture with more  extensive tendinosis and tendinopathy than normal.  Areas of fibrosis, recent areas of hemorrhage and poor fiber quality that was mushy.  FiberWire #2 was taken and total of five sutures were placed and a Bunnell weave interspace from lateral to medial through the tendon with both ends of the suture coming up through the tip of the tendon.  There was a small stump of tendon adjacent to the inferior pole of the patella, which was primarily the anterior surface of the tendon.  Using large Kiowa needles, they were drilled from distal to proximal coming through with the tendon attaches and then coming to the anterior surface of the patellar tendon.  Sequentially, on each end, one single suture was passed on the medial and lateral and then the rest of the needles had too that went up through them.  Sequentially, they were tied.  The sutures were tightened down along the tendon up to the bone and then sequentially tight for one and to the other with 6-7 notch per each time.  FiberWires were cut with the scalpel.  Knee was flexed to 60 degrees.  Patellar repair held together.  Arm[employer] was placed first on the medial side, then on lateral and #1 Ethilon sutures were used to repair the medial and lateral retinaculum where it had torn extending back to the medial  collateral ligament and lateral collateral ligament. This was closed with figure-of-eight sutures.  Once both capsules were closed, tourniquet was deflated.  The joint had been irrigated, ACL was inspected, collateral ligaments were stable.  A 2-0 Vicryl was placed in the subcutaneous tissue, 3-0 Vicryl in the subcuticular skin closure, Dermabond on the skin followed by Adaptic, 4x4s, ABD, Webril, Ace wrap, knee immobilizer.  Instrument count and needle count were correct.  The patient tolerated the procedure well, and will be admitted for postop pain and physical therapy immobilization.     Arleta Ostrum C. Ophelia Charter, M.D.     MCY/MEDQ  D:   08/05/2012  T:  08/06/2012  Job:  [id number]

## 2012-08-06 NOTE — Progress Notes (Signed)
Subjective: 1 Day Post-Op Procedure(s) (LRB): PATELLA TENDON REPAIR (Right) Patient reports pain as moderate.    Objective: Vital signs in last 24 hours: Temp:  [97.2 F (36.2 C)-99.4 F (37.4 C)] 99 F (37.2 C) (10/01 0731) Pulse Rate:  [68-99] 92  (10/01 0731) Resp:  [12-21] 18  (10/01 0731) BP: (120-151)/(80-98) 139/83 mmHg (10/01 0731) SpO2:  [94 %-98 %] 94 % (10/01 0731) Weight:  [105 kg (231 lb 7.7 oz)] 105 kg (231 lb 7.7 oz) (09/30 1542)  Intake/Output from previous day: 09/30 0701 - 10/01 0700 In: 1650 [I.V.:1650] Out: -  Intake/Output this shift:    No results found for this basename: HGB:5 in the last 72 hours No results found for this basename: WBC:2,RBC:2,HCT:2,PLT:2 in the last 72 hours No results found for this basename: NA:2,K:2,CL:2,CO2:2,BUN:2,CREATININE:2,GLUCOSE:2,CALCIUM:2 in the last 72 hours No results found for this basename: LABPT:2,INR:2 in the last 72 hours  Neurovascular intact Sensation intact distally Dorsiflexion/Plantar flexion intact Incision: dressing C/D/I  Assessment/Plan: 1 Day Post-Op Procedure(s) (LRB): PATELLA TENDON REPAIR (Right) Plan for discharge tomorrow after stable with ambulation.  Victor Young M 08/06/2012, 12:43 PM

## 2012-08-06 NOTE — Progress Notes (Signed)
Utilization review completed. Nailea Whitehorn, RN, BSN. 

## 2012-08-06 NOTE — Progress Notes (Signed)
Physical Therapy Treatment Patient Details Name: Victor Young MRN: [id number] DOB: [birth year] Today's Date: 08/06/2012 Time: 1002-1030 PT Time Calculation (min): 28 min  PT Assessment / Plan / Recommendation Comments on Treatment Session       Follow Up Recommendations  Home health PT    Barriers to Discharge None      Equipment Recommendations  Rolling walker with 5" wheels    Recommendations for Other Services    Frequency Min 5X/week   Plan      Precautions / Restrictions Precautions Precautions: Fall Required Braces or Orthoses: Knee Immobilizer - Right Knee Immobilizer - Right: On at all times Restrictions Weight Bearing Restrictions: Yes RLE Weight Bearing: Weight bearing as tolerated Other Position/Activity Restrictions: No ROM to right knee.   Pertinent Vitals/Pain None currently.  Pt premedicated.    Mobility  Bed Mobility Bed Mobility: Supine to Sit Supine to Sit: 4: Min assist Details for Bed Mobility Assistance: Assist for right LE due to weakness.  Cues for sequence. Transfers Transfers: Sit to Stand;Stand to Sit Sit to Stand: 4: Min assist;With upper extremity assist;From bed Stand to Sit: 4: Min assist;With upper extremity assist;To chair/3-in-1 Details for Transfer Assistance: Assist for balance and to off weight right LE.  Cues for safest hand/right LE placement. Ambulation/Gait Ambulation/Gait Assistance: 4: Min assist Ambulation Distance (Feet): 120 Feet Assistive device: Rolling walker Ambulation/Gait Assistance Details: Assist for balance and to off weight right LE due to pain.  Cues for safest sequence and tall posture. Gait Pattern: Step-to pattern;Decreased step length - right;Decreased stance time - right;Trunk flexed Stairs: No Wheelchair Mobility Wheelchair Mobility: No    Exercises     PT Diagnosis: Difficulty walking;Acute pain  PT Problem List: Decreased strength;Decreased activity tolerance;Decreased balance;Decreased  mobility;Decreased knowledge of use of DME;Decreased knowledge of precautions;Pain PT Treatment Interventions: DME instruction;Gait training;Stair training;Functional mobility training;Therapeutic activities;Balance training;Patient/family education   PT Goals Acute Rehab PT Goals PT Goal Formulation: With patient Time For Goal Achievement: 08/13/12 Potential to Achieve Goals: Good Pt will go Supine/Side to Sit: with modified independence PT Goal: Supine/Side to Sit - Progress: Goal set today Pt will go Sit to Supine/Side: with modified independence PT Goal: Sit to Supine/Side - Progress: Goal set today Pt will go Sit to Stand: with modified independence PT Goal: Sit to Stand - Progress: Goal set today Pt will go Stand to Sit: with modified independence PT Goal: Stand to Sit - Progress: Goal set today Pt will Ambulate: >150 feet;with modified independence;with least restrictive assistive device PT Goal: Ambulate - Progress: Goal set today Pt will Go Up / Down Stairs: Flight;with supervision;with least restrictive assistive device PT Goal: Up/Down Stairs - Progress: Goal set today  Visit Information  Last PT Received On: 08/06/12 Assistance Needed: +1    Subjective Data  Subjective: "I'm fine now, but I'm guessing it may change." Patient Stated Goal: Go home.   Cognition  Overall Cognitive Status: Appears within functional limits for tasks assessed/performed Arousal/Alertness: Awake/alert Orientation Level: Appears intact for tasks assessed Behavior During Session: [facility] for tasks performed    Balance  Balance Balance Assessed: No  End of Session PT - End of Session Equipment Utilized During Treatment: Gait belt;Right knee immobilizer Activity Tolerance: Patient tolerated treatment well Patient left: in chair;with call bell/phone within reach Nurse Communication: Mobility status   GP     [name] 08/06/2012, 10:35 AM  08/06/2012 [name], PT,  DPT [phone]

## 2012-08-07 ENCOUNTER — Encounter: Payer: Self-pay | Admitting: Cardiovascular Disease

## 2012-08-07 MED ORDER — METHOCARBAMOL 500 MG PO TABS: 500.0000 mg | ORAL_TABLET | Freq: Four times a day (QID) | ORAL | Status: DC | PRN

## 2012-08-07 MED ORDER — BISACODYL 10 MG RE SUPP
10.0000 mg | Freq: Once | RECTAL | Status: AC
  Administered 2012-08-07: 10 mg via RECTAL
  Filled 2012-08-07: qty 1

## 2012-08-07 MED ORDER — OXYCODONE-ACETAMINOPHEN 5-325 MG PO TABS: 1.0000 | ORAL_TABLET | ORAL | Status: DC | PRN

## 2012-08-07 MED ORDER — DSS 100 MG PO CAPS: 100.0000 mg | ORAL_CAPSULE | Freq: Two times a day (BID) | ORAL | Status: DC

## 2012-08-07 NOTE — Discharge Summary (Signed)
Physician Discharge Summary  Patient ID: GAITHER NUDING MRN: [id number] DOB/AGE: [birth year] 57 y.o.  Admit date: 08/05/2012 Discharge date: 08/07/2012  Admission Diagnoses:right patellar tendon rupture   Discharge Diagnoses:  Principal Problem:  *Patellar tendon rupture 2 Discharged Condition: good  Hospital Course: admitted underwent surgery, therapy post op and discharged when safe with ambulation and pain controlled  Consults: None  Significant Diagnostic Studies:   Treatments: surgery: patellar tendon repair  Discharge Exam: Blood pressure 161/101, pulse 103, temperature 98.5 F (36.9 C), temperature source Oral, resp. rate 18, height 6' 0.05" (1.83 m), weight 105 kg (231 lb 7.7 oz), SpO2 99.00%. Incision/Wound: dry and clean , knee immobilizer and dressing intact  Disposition: 01-Home or Self Care     Medication List     As of 08/07/2012  7:22 AM    TAKE these medications         allopurinol 300 MG tablet   Commonly known as: ZYLOPRIM   Take 300 mg by mouth daily.      amLODipine 10 MG tablet   Commonly known as: NORVASC   Take 10 mg by mouth daily.      aspirin 325 MG tablet   Take 325 mg by mouth daily.      DSS 100 MG Caps   Take 100 mg by mouth 2 (two) times daily.      lisinopril-hydrochlorothiazide 20-25 MG per tablet   Commonly known as: PRINZIDE,ZESTORETIC   Take 1 tablet by mouth daily.      methocarbamol 500 MG tablet   Commonly known as: ROBAXIN   Take 1 tablet (500 mg total) by mouth every 6 (six) hours as needed.      metoprolol succinate 25 MG 24 hr tablet   Commonly known as: TOPROL-XL   Take 25 mg by mouth daily.      oxyCODONE-acetaminophen 5-325 MG per tablet   Commonly known as: PERCOCET/ROXICET   Take 1-2 tablets by mouth every 4 (four) hours as needed for pain.           Follow-up Information    Schedule an appointment as soon as possible for a visit with Eldred Manges, MD.   Contact information:   [facility] [address] [place] [address] [phone]          Signed: Eldred Manges 08/07/2012, 7:22 AM

## 2012-08-07 NOTE — Progress Notes (Signed)
Physical Therapy Treatment and Discharge Summary Patient Details Name: Victor Young MRN: [id number] DOB: 1956-05-19 Today's Date: 08/07/2012 Time: 1300-1330 PT Time Calculation (min): 30 min  PT Assessment / Plan / Recommendation Comments on Treatment Session  Pt has tolerated treatment well, reviewed the reason for precautions so that pt is diligent with knee immobilizer and no ROM. Pt has completed PT goals and is ready for d/c home.  Recommend f/u with outpt PT when appropriate.    Follow Up Recommendations  Outpatient PT    Barriers to Discharge        Equipment Recommendations  Rolling walker with 5" wheels    Recommendations for Other Services    Frequency Min 5X/week   Plan Discharge plan needs to be updated;Frequency remains appropriate    Precautions / Restrictions Precautions Precautions: Fall Required Braces or Orthoses: Knee Immobilizer - Right Knee Immobilizer - Right: On at all times Restrictions Weight Bearing Restrictions: Yes RLE Weight Bearing: Weight bearing as tolerated Other Position/Activity Restrictions: No ROM to right knee.   Pertinent Vitals/Pain Pain "tolerable" for d/c home    Mobility  Bed Mobility Bed Mobility: Supine to Sit;Sit to Supine;Sitting - Scoot to Edge of Bed Supine to Sit: 6: Modified independent (Device/Increase time) Sitting - Scoot to Edge of Bed: 6: Modified independent (Device/Increase time) Sit to Supine: 6: Modified independent (Device/Increase time) Details for Bed Mobility Assistance: pt using left LE to assist right and able to perform without physical assist.  Educated pt's wife on how and when to help pt Transfers Transfers: Sit to Stand;Stand to [employer] to Stand: 6: Modified independent (Device/Increase time) Stand to Sit: 6: Modified independent (Device/Increase time) Details for Transfer Assistance: pt transferring with independence Ambulation/Gait Ambulation/Gait Assistance: 6: Modified independent (Device/Increase  time) Ambulation Distance (Feet): 300 Feet Assistive device: Rolling walker Ambulation/Gait Assistance Details: pt ambulating independently and tolerating increased distance Gait Pattern: Step-to pattern;Decreased step length - right;Decreased stance time - right;Trunk flexed Gait velocity: decreased Stairs: Yes Stairs Assistance: 5: Supervision Stairs Assistance Details (indicate cue type and reason): pt taught to perform stairs sideways with rail and also to perform fwd with one rail and one crutch Stair Management Technique: Forwards;Sideways;Step to pattern;One rail Right;With crutches Number of Stairs: 10  Wheelchair Mobility Wheelchair Mobility: No    Exercises General Exercises - Lower Extremity Ankle Circles/Pumps: AROM;Both;20 reps;Supine   PT Diagnosis:    PT Problem List:   PT Treatment Interventions:     PT Goals Acute Rehab PT Goals PT Goal Formulation: With patient Time For Goal Achievement: 08/13/12 Potential to Achieve Goals: Good Pt will go Supine/Side to Sit: with modified independence PT Goal: Supine/Side to Sit - Progress: Met Pt will go Sit to Supine/Side: with modified independence PT Goal: Sit to Supine/Side - Progress: Met Pt will go Sit to Stand: with modified independence PT Goal: Sit to Stand - Progress: Met Pt will go Stand to Sit: with modified independence PT Goal: Stand to Sit - Progress: Met Pt will Ambulate: >150 feet;with modified independence;with least restrictive assistive device PT Goal: Ambulate - Progress: Met Pt will Go Up / Down Stairs: Flight;with supervision;with least restrictive assistive device PT Goal: Up/Down Stairs - Progress: Met  Visit Information  Last PT Received On: 08/07/12 Assistance Needed: +1    Subjective Data  Subjective: when will I be able to walk normally again? Patient Stated Goal: Go home.   Cognition  Overall Cognitive Status: Appears within functional limits for tasks  assessed/performed Arousal/Alertness: Awake/alert  Orientation Level: Appears intact for tasks assessed Behavior During Session: [facility] for tasks performed    Balance  Balance Balance Assessed: Yes Dynamic Standing Balance Dynamic Standing - Balance Support: Right upper extremity supported;During functional activity Dynamic Standing - Level of Assistance: 6: Modified independent (Device/Increase time)  End of Session PT - End of Session Equipment Utilized During Treatment: Gait belt;Right knee immobilizer Activity Tolerance: Patient tolerated treatment well Patient left: in bed;with call bell/phone within reach;with family/visitor present Nurse Communication: Mobility status   GP   Lyanne Co, PT  Acute Rehab Services  [phone]   Lyanne Co 08/07/2012, 2:30 PM

## 2012-08-21 ENCOUNTER — Ambulatory Visit: Payer: BC Managed Care – PPO | Attending: Orthopaedic Surgery | Admitting: Physical Therapy

## 2012-08-21 DIAGNOSIS — R262 Difficulty in walking, not elsewhere classified: Secondary | ICD-10-CM | POA: Insufficient documentation

## 2012-08-21 DIAGNOSIS — IMO0001 Reserved for inherently not codable concepts without codable children: Secondary | ICD-10-CM | POA: Insufficient documentation

## 2012-08-22 ENCOUNTER — Ambulatory Visit: Payer: BC Managed Care – PPO | Admitting: Physical Therapy

## 2012-08-27 ENCOUNTER — Ambulatory Visit: Payer: BC Managed Care – PPO | Admitting: Physical Therapy

## 2012-08-29 ENCOUNTER — Ambulatory Visit: Payer: BC Managed Care – PPO | Admitting: Physical Therapy

## 2012-09-03 ENCOUNTER — Ambulatory Visit: Payer: BC Managed Care – PPO | Admitting: Physical Therapy

## 2012-09-05 ENCOUNTER — Ambulatory Visit: Payer: BC Managed Care – PPO | Admitting: Physical Therapy

## 2012-09-10 ENCOUNTER — Ambulatory Visit: Payer: BC Managed Care – PPO | Attending: Orthopaedic Surgery | Admitting: Physical Therapy

## 2012-09-10 DIAGNOSIS — R262 Difficulty in walking, not elsewhere classified: Secondary | ICD-10-CM | POA: Insufficient documentation

## 2012-09-10 DIAGNOSIS — IMO0001 Reserved for inherently not codable concepts without codable children: Secondary | ICD-10-CM | POA: Insufficient documentation

## 2012-09-12 ENCOUNTER — Ambulatory Visit: Payer: BC Managed Care – PPO | Admitting: Physical Therapy

## 2012-09-17 ENCOUNTER — Ambulatory Visit: Payer: BC Managed Care – PPO | Admitting: Physical Therapy

## 2012-09-19 ENCOUNTER — Ambulatory Visit: Payer: BC Managed Care – PPO | Admitting: Physical Therapy

## 2012-09-24 ENCOUNTER — Ambulatory Visit: Payer: BC Managed Care – PPO | Admitting: Physical Therapy

## 2012-09-26 ENCOUNTER — Ambulatory Visit: Payer: BC Managed Care – PPO | Admitting: Physical Therapy

## 2012-10-01 ENCOUNTER — Ambulatory Visit: Payer: BC Managed Care – PPO | Admitting: Physical Therapy

## 2012-10-08 ENCOUNTER — Ambulatory Visit: Payer: BC Managed Care – PPO | Attending: Orthopaedic Surgery | Admitting: Physical Therapy

## 2012-10-08 DIAGNOSIS — R262 Difficulty in walking, not elsewhere classified: Secondary | ICD-10-CM | POA: Insufficient documentation

## 2012-10-08 DIAGNOSIS — IMO0001 Reserved for inherently not codable concepts without codable children: Secondary | ICD-10-CM | POA: Insufficient documentation

## 2012-10-10 ENCOUNTER — Ambulatory Visit: Payer: BC Managed Care – PPO | Admitting: Physical Therapy

## 2012-10-15 ENCOUNTER — Ambulatory Visit: Payer: BC Managed Care – PPO | Admitting: Physical Therapy

## 2014-07-23 IMAGING — CR DG KNEE COMPLETE 4+V*R*
4 series · 4 of 4 positions shown · non-contrast
Comparison: None.

CLINICAL DATA: Knee injury

RIGHT KNEE - COMPLETE 4+ VIEW

[t knee ap right]
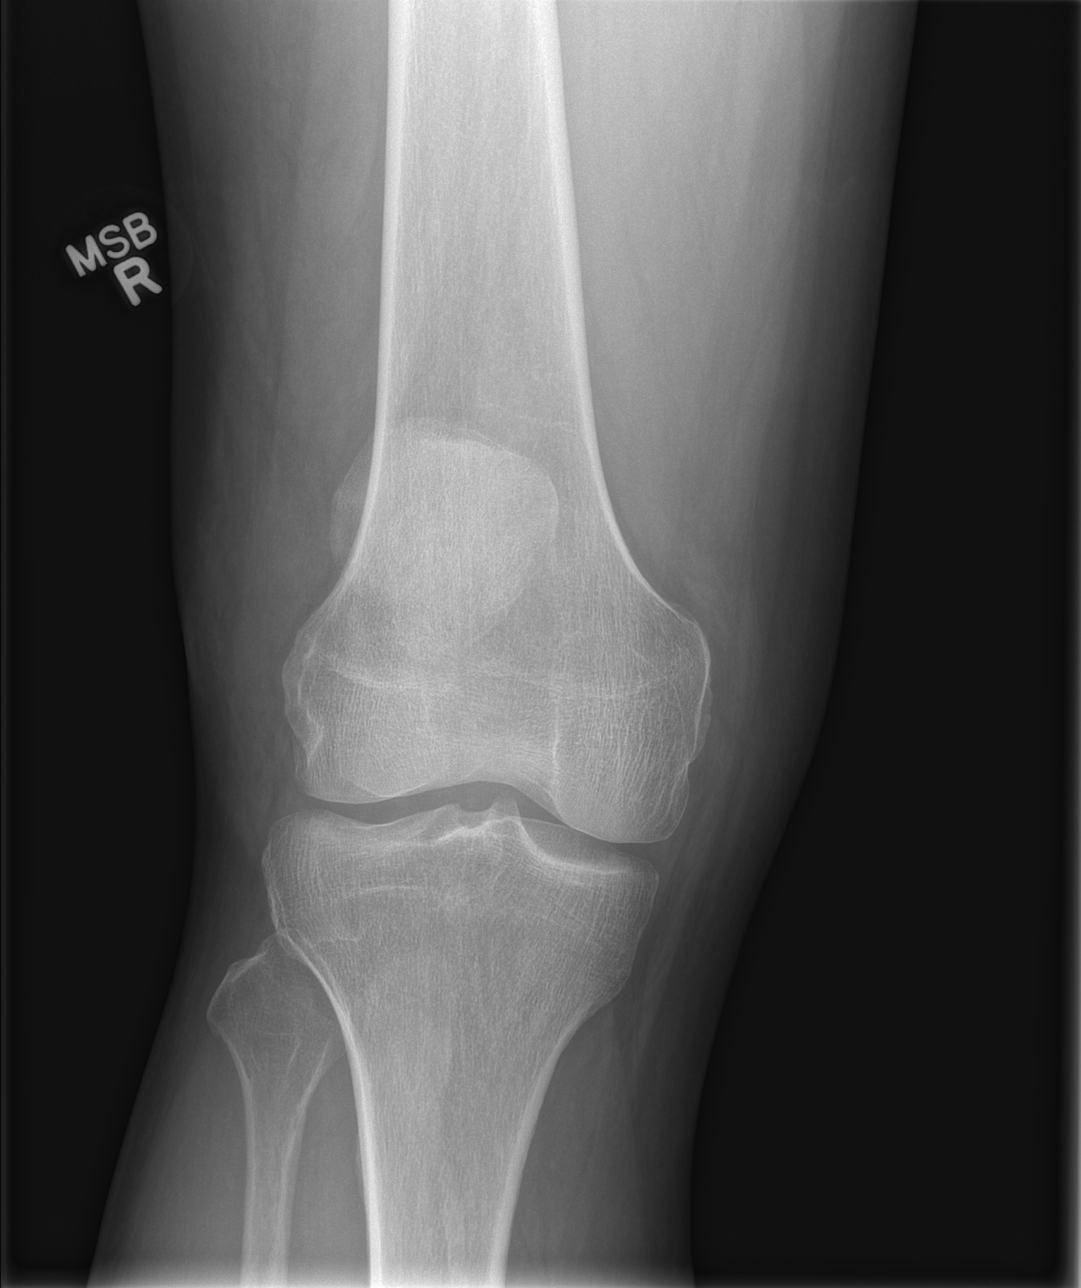

[t knee obl right (1 of 2)]
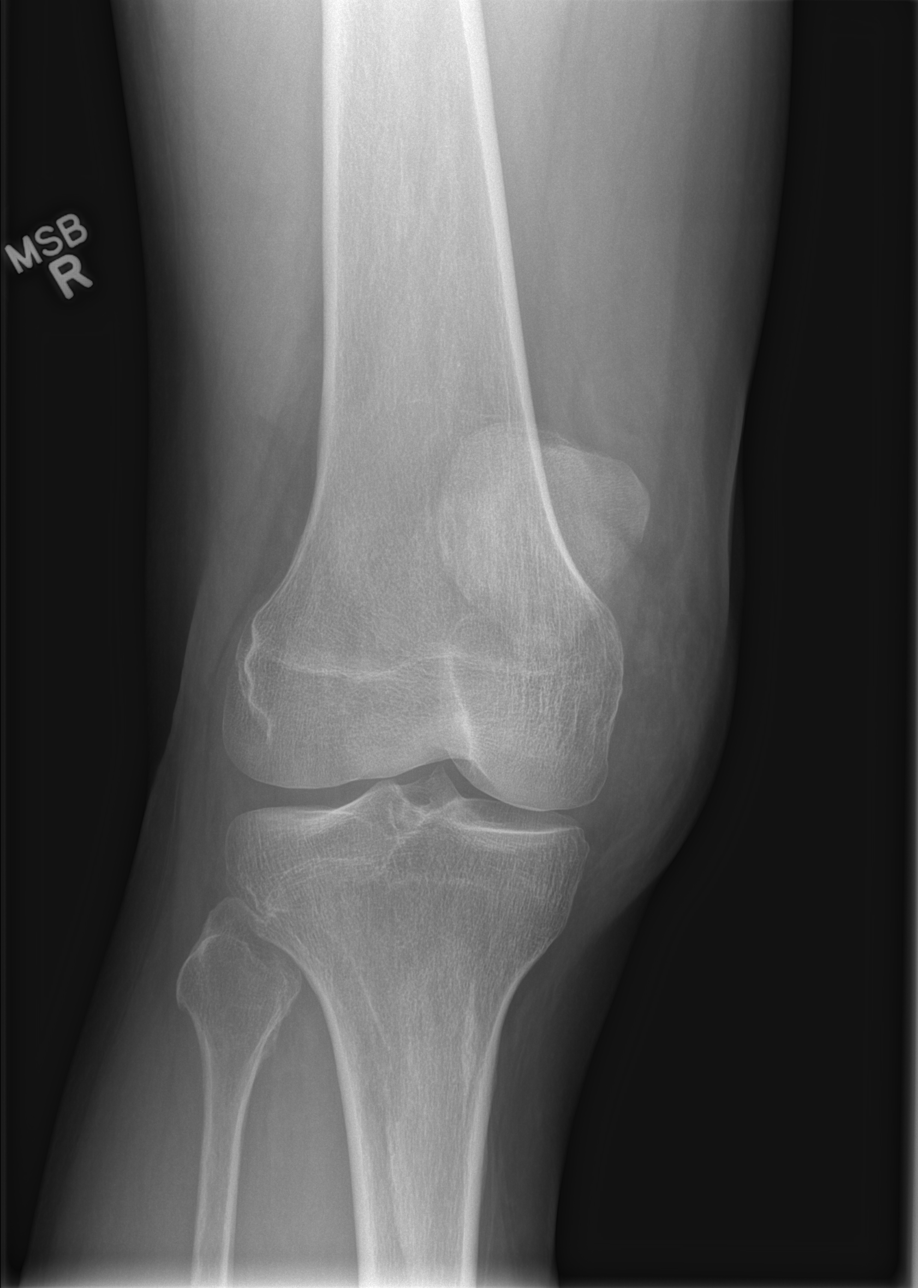

[t knee obl right (2 of 2)]
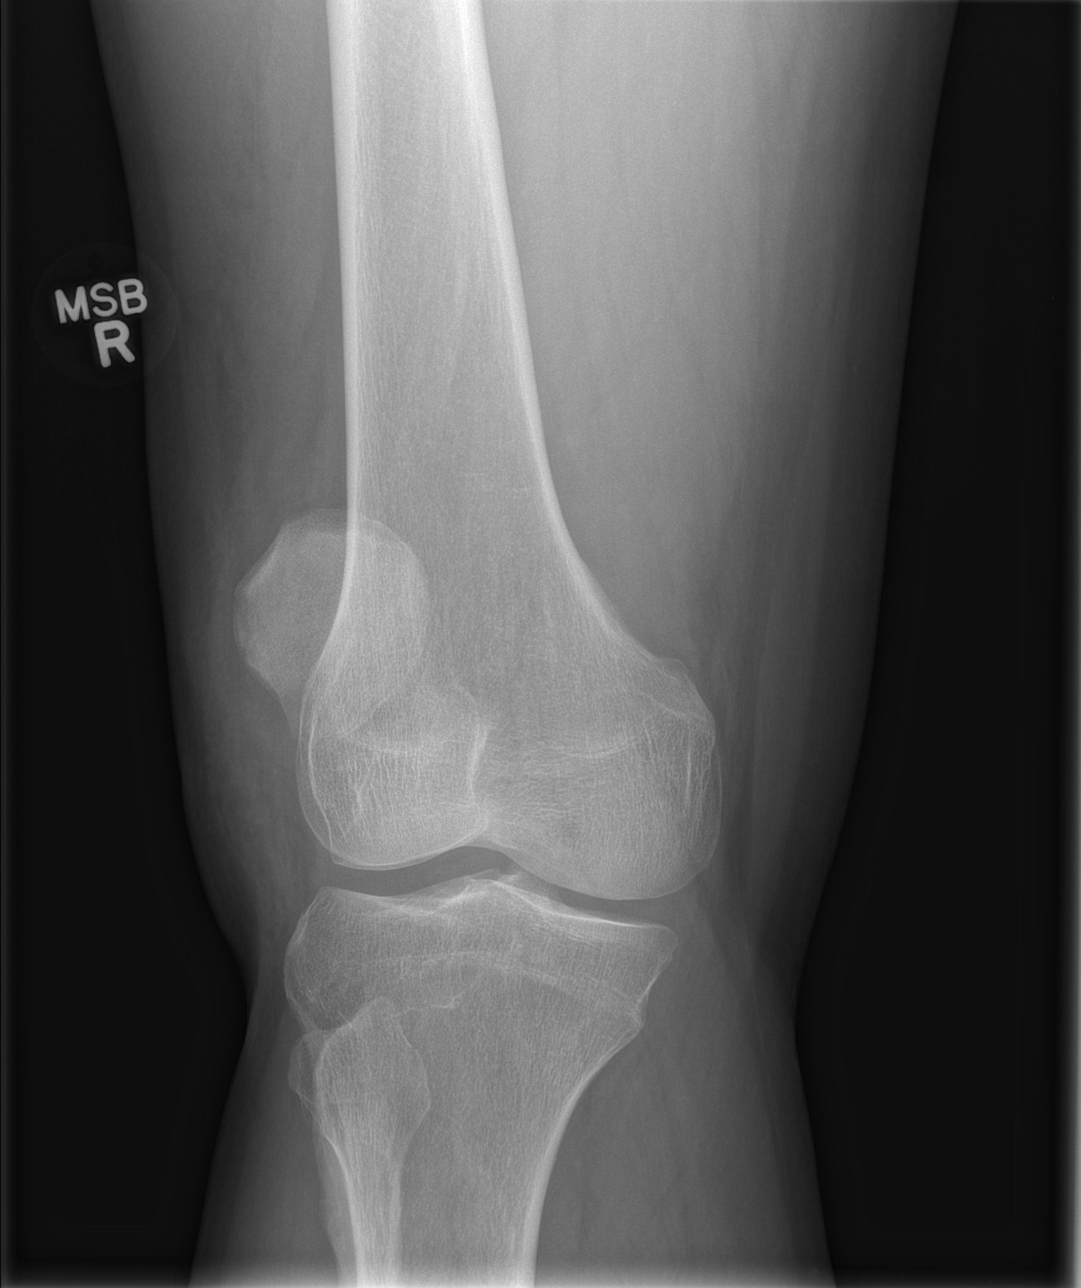

[t knee lat right]
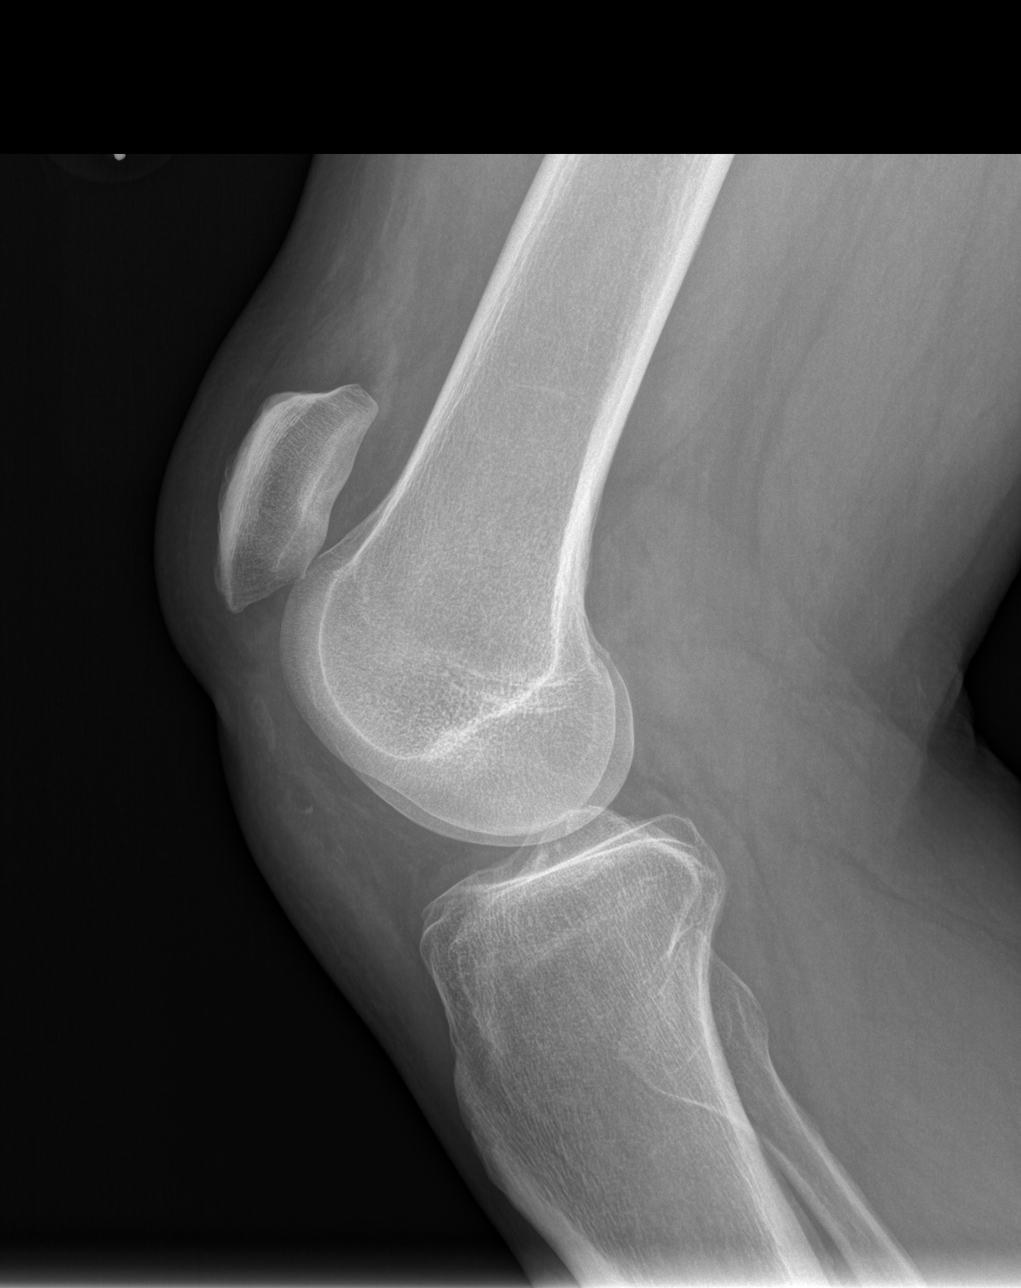

[4 of 4 positions shown; findings below may reference images not displayed]

FINDINGS: Four views of the right knee submitted.  No acute
fracture.  There is superolateral position of the patella
consistent with superolateral subluxation.  Patellar tendon injury
cannot be excluded.  Clinical correlation is necessary.
IMPRESSION: No acute fracture.  There is superolateral position of the patella
consistent with superolateral subluxation.  Patellar tendon injury
cannot be excluded.  Clinical correlation is necessary.]

## 2016-03-28 ENCOUNTER — Telehealth: Payer: Self-pay | Admitting: Cardiology

## 2016-03-28 NOTE — Telephone Encounter (Signed)
Received records from [facility] for appointment on 03/30/16 with Dr Antoine PocheHochrein.  [facility] Dr Hochrein's schedule on 03/30/16. lp

## 2016-03-29 NOTE — Progress Notes (Signed)
Cardiology Office Note   Date:  03/30/2016   ID:  Victor Young, DOB 04/18/1956, MRN [id number]  PCP:  Mickie HillierLITTLE,KEVIN LORNE, MD  Cardiologist:   Rollene RotundaJames Brayn Eckstein, MD   Chief Complaint  Patient presents with  . New Evaluation    Patient has no complaints.      History of Present Illness: Victor Young is a 60 y.o. male who presents for evaluation of atrial fibrillation. He has had this chronically.  He was seen years ago for this by another audiology group and felt cardioversion. He's been on aspirin since then. He denies any other cardiovascular history. He has been exercising daily since February. The patient denies any new symptoms such as chest discomfort, neck or arm discomfort. There has been no new shortness of breath, PND or orthopnea. There have been no reported palpitations, presyncope or syncope.    Past Medical History  Diagnosis Date  . Atrial fibrillation (HCC)   . Hypertension   . Gout     Past Surgical History  Procedure Laterality Date  . Hernia repair    . Dermoid cyst  excision    . Tonsillectomy    . Patellar tendon repair  08/05/2012    Procedure: PATELLA TENDON REPAIR;  Surgeon: Eldred MangesMark C Yates, MD;  [facility];  Service: Orthopedics;  Laterality: Right;  Right Patella Tendon Repair     Current Outpatient Prescriptions  Medication Sig Dispense Refill  . allopurinol (ZYLOPRIM) 300 MG tablet Take 300 mg by mouth daily.    Marland Kitchen. amLODipine (NORVASC) 5 MG tablet Take 5 mg by mouth daily.    Marland Kitchen. aspirin 325 MG tablet Take 325 mg by mouth daily.    Marland Kitchen. lisinopril-hydrochlorothiazide (PRINZIDE,ZESTORETIC) 20-25 MG per tablet Take 1 tablet by mouth daily.    . metoprolol succinate (TOPROL-XL) 25 MG 24 hr tablet Take 25 mg by mouth daily.    . potassium chloride (K-DUR,KLOR-CON) 10 MEQ tablet Take 10 mEq by mouth 3 (three) times daily.     No current facility-administered medications for this visit.    Allergies:   Review of patient's allergies indicates no known  allergies.    Social History:  The patient  reports that he has quit smoking. He does not have any smokeless tobacco history on file. He reports that he drinks about 12.6 oz of alcohol per week. He reports that he does not use illicit drugs.   Family History:  The patient's family history includes Atrial fibrillation in his paternal uncle; Hypertension in his father; Stroke in his father.    ROS:  Please see the history of present illness.   Otherwise, review of systems are positive for none.   All other systems are reviewed and negative.    PHYSICAL EXAM: VS:  BP 128/84 mmHg  Pulse 95  Ht 6' (1.829 m)  Wt 233 lb (105.688 kg)  BMI 31.59 kg/m2 , BMI Body mass index is 31.59 kg/(m^2). GENERAL:  Well appearing HEENT:  Pupils equal round and reactive, fundi not visualized, oral mucosa unremarkable NECK:  No jugular venous distention, waveform within normal limits, carotid upstroke brisk and symmetric, no bruits, no thyromegaly LYMPHATICS:  No cervical, inguinal adenopathy LUNGS:  Clear to auscultation bilaterally BACK:  No CVA tenderness CHEST:  Unremarkable HEART:  PMI not displaced or sustained,S1 and S2 within normal limits, no S3, no clicks, no rubs, no murmurs, irregular ABD:  Flat, positive bowel sounds normal in frequency in pitch, no bruits, no rebound, no  guarding, no midline pulsatile mass, no hepatomegaly, no splenomegaly EXT:  2 plus pulses throughout, no edema, no cyanosis no clubbing SKIN:  No rashes no nodules NEURO:  Cranial nerves II through XII grossly intact, motor grossly intact throughout PSYCH:  Cognitively intact, oriented to person place and time    EKG:  EKG is ordered today. The ekg ordered today demonstrates atrial fibrillation, rate 95, axis within normal limits, intervals within normal limits, no acute ST-T wave changes.   Recent Labs: No results found for requested labs within last 365 days.    Lipid Panel No results found for: CHOL, TRIG, HDL,  CHOLHDL, VLDL, LDLCALC, LDLDIRECT    Wt Readings from Last 3 Encounters:  03/30/16 233 lb (105.688 kg)  08/05/12 231 lb 7.7 oz (105 kg)  07/31/12 231 lb 12.8 oz (105.144 kg)      Other studies Reviewed: Additional studies/ records that were reviewed today include: [employer]. Review of the above records demonstrates:  Please see elsewhere in the note.     ASSESSMENT AND PLAN:  ATRIAL FIB:  Victor Young has a CHA2DS2 - VASc score of 1 with a risk of stroke of 1.3%. At this point the patient has options. I would suggest Xarelto but he would consider this. He understands the lack of data surrounding the benefit of aspirin. I'm also going to bring him back to screen for coronary disease and evaluate his rate.  RISK REDUCTION:  I will bring the patient back for a POET (Plain Old Exercise Test). This will allow me to screen for obstructive coronary disease, risk stratify and very importantly provide a prescription for exercise.  OVERWEIGHT:  I discussed risk reduction.   HTN:  The blood pressure is at target. No change in medications is indicated. We will continue with therapeutic lifestyle changes (TLC).   Current medicines are reviewed at length with the patient today.  The patient does not have concerns regarding medicines.  The following changes have been made:  no change  Labs/ tests ordered today include:   Orders Placed This Encounter  Procedures  . Exercise Tolerance Test  . EKG 12-Lead     Disposition:   FU with me in six months.     Signed, Rollene Rotunda, MD  03/30/2016 10:10 AM    [facility]

## 2016-03-30 ENCOUNTER — Encounter: Payer: Self-pay | Admitting: Cardiology

## 2016-03-30 ENCOUNTER — Ambulatory Visit (INDEPENDENT_AMBULATORY_CARE_PROVIDER_SITE_OTHER): Payer: BLUE CROSS/BLUE SHIELD | Admitting: Cardiology

## 2016-03-30 VITALS — BP 128/84 | HR 95 | Ht 72.0 in | Wt 233.0 lb

## 2016-03-30 DIAGNOSIS — I4891 Unspecified atrial fibrillation: Secondary | ICD-10-CM

## 2016-03-30 NOTE — Patient Instructions (Signed)
Medication Instructions:  Continue current medications  Labwork: NONE  Testing/Procedures: Your physician has requested that you have an exercise tolerance test. For further information please visit [email or web address]. Please also follow instruction sheet, as given.  Follow-Up: Your physician wants you to follow-up in: 6 Months. You will receive a reminder letter in the mail two months in advance. If you don't receive a letter, please call our office to schedule the follow-up appointment.   Any Other Special Instructions Will Be Listed Below (If Applicable).   If you need a refill on your cardiac medications before your next appointment, please call your pharmacy.

## 2016-04-18 ENCOUNTER — Telehealth (HOSPITAL_COMMUNITY): Payer: Self-pay

## 2016-04-18 NOTE — Telephone Encounter (Signed)
Encounter complete. 

## 2016-04-20 ENCOUNTER — Ambulatory Visit (HOSPITAL_COMMUNITY)
Admission: RE | Admit: 2016-04-20 | Discharge: 2016-04-20 | Disposition: A | Discharge status: Home / Self Care | Payer: BLUE CROSS/BLUE SHIELD | Source: Ambulatory Visit | Attending: Internal Medicine | Admitting: Internal Medicine

## 2016-04-20 DIAGNOSIS — I4891 Unspecified atrial fibrillation: Secondary | ICD-10-CM | POA: Insufficient documentation

## 2016-04-20 LAB — EXERCISE TOLERANCE TEST
CHL CUP RESTING HR STRESS: 114 {beats}/min
CHL RATE OF PERCEIVED EXERTION: 16
CSEPED: 7 min
CSEPEDS: 0 s
Estimated workload: 8.5 METS
MPHR: 160 {beats}/min
Peak HR: 196 {beats}/min
Percent HR: 122 %

## 2016-04-21 ENCOUNTER — Telehealth: Payer: Self-pay | Admitting: *Deleted

## 2016-04-21 MED ORDER — METOPROLOL SUCCINATE ER 50 MG PO TB24: 50.0000 mg | ORAL_TABLET | Freq: Every day | ORAL | Status: DC

## 2016-04-21 NOTE — Telephone Encounter (Signed)
Spoke with pt about his stress test, and to increase Toprol xl 50 mg daily new rx send to pt pharmacy #[phone] refills  Result send to pt PCP via Epic

## 2016-04-21 NOTE — Telephone Encounter (Signed)
-----   Message from Rollene RotundaJames Hochrein, MD sent at 04/20/2016  6:18 PM EDT ----- Negative stress test for ischemia.  However, he had a very rapid heart rate even in stage 1 of exercise.  Please call him and ask him to increase his Toprol XL to 50 mg daily.  Call Mr. Dallas BreedingKeany with the results and send results to Mickie HillierLITTLE,KEVIN LORNE, MD

## 2017-05-04 ENCOUNTER — Other Ambulatory Visit: Payer: Self-pay | Admitting: Cardiology

## 2017-05-04 NOTE — Telephone Encounter (Signed)
Rx(s) sent to pharmacy electronically.  

## 2017-08-21 ENCOUNTER — Telehealth: Payer: Self-pay | Admitting: Cardiology

## 2017-09-03 ENCOUNTER — Other Ambulatory Visit: Payer: Self-pay

## 2017-09-03 MED ORDER — METOPROLOL SUCCINATE ER 50 MG PO TB24
50.0000 mg | ORAL_TABLET | Freq: Every day | ORAL | 0 refills | Status: DC
Start: 2017-09-03 — End: 2017-09-06

## 2017-09-03 NOTE — Telephone Encounter (Signed)
New message     *STAT* If patient is at the pharmacy, call can be transferred to refill team.   1. Which medications need to be refilled? (please list name of each medication and dose if known) Metoprolol 50 mg  2. Which pharmacy/location (including street and city if local pharmacy) is medication to be sent to? [facility] on [address]  3. Do they need a 30 day or 90 day supply? 30 day

## 2017-09-06 ENCOUNTER — Encounter: Payer: Self-pay | Admitting: Cardiology

## 2017-09-06 ENCOUNTER — Ambulatory Visit (INDEPENDENT_AMBULATORY_CARE_PROVIDER_SITE_OTHER): Payer: BLUE CROSS/BLUE SHIELD | Admitting: Cardiology

## 2017-09-06 DIAGNOSIS — I482 Chronic atrial fibrillation, unspecified: Secondary | ICD-10-CM | POA: Insufficient documentation

## 2017-09-06 DIAGNOSIS — I10 Essential (primary) hypertension: Secondary | ICD-10-CM

## 2017-09-06 MED ORDER — METOPROLOL SUCCINATE ER 50 MG PO TB24: 50.0000 mg | ORAL_TABLET | Freq: Every day | ORAL | 3 refills | Status: DC

## 2017-09-06 NOTE — Progress Notes (Signed)
09/06/2017 [place]   July 26, 1956  [id number]  Primary Physician Catha Gosselin, MD Primary Cardiologist: dr Antoine Poche  HPI:  Pleasant 61 y/o male with a history of CAF. He had a past failed cardioversion by another cardiology group. A POET done last year was negative for ischemia. His HR was up and his beta blocker was increased then. He is in the office today to get his Toprol refilled. He remains asymptomatic, no unusual dyspnea, no tachycardia. He exercises with a stationary bike and does core work with an exercise ball.    Current Outpatient Prescriptions  Medication Sig Dispense Refill  . allopurinol (ZYLOPRIM) 300 MG tablet Take 300 mg by mouth daily.    Marland Kitchen amLODipine (NORVASC) 5 MG tablet Take 5 mg by mouth daily.    Marland Kitchen aspirin 325 MG tablet Take 325 mg by mouth daily.    Marland Kitchen lisinopril-hydrochlorothiazide (PRINZIDE,ZESTORETIC) 20-25 MG per tablet Take 1 tablet by mouth daily.    . metoprolol succinate (TOPROL-XL) 50 MG 24 hr tablet Take 1 tablet (50 mg total) by mouth daily. Take with or immediately following a meal. 30 tablet 0  . potassium chloride (K-DUR,KLOR-CON) 10 MEQ tablet Take 10 mEq by mouth 3 (three) times daily.     No current facility-administered medications for this visit.     No Known Allergies  Past Medical History:  Diagnosis Date  . Atrial fibrillation (HCC)   . Gout   . Hypertension     Social History   Social History  . Marital status: Married    Spouse name: N/A  . Number of children: N/A  . Years of education: N/A   Occupational History  . Not on file.   Social History Main Topics  . Smoking status: Former Games developer  . Smokeless tobacco: Not on file  . Alcohol use 12.6 oz/week    21 Cans of beer per week  . Drug use: No  . Sexual activity: Not on file   Other Topics Concern  . Not on file   Social History Narrative   Lives at home with wife.      Family History  Problem Relation Age of Onset  . Stroke Father   . Hypertension  Father   . Atrial fibrillation Paternal Uncle      Review of Systems: General: negative for chills, fever, night sweats or weight changes.  Cardiovascular: negative for chest pain, dyspnea on exertion, edema, orthopnea, palpitations, paroxysmal nocturnal dyspnea or shortness of breath Dermatological: negative for rash Respiratory: negative for cough or wheezing Urologic: negative for hematuria Abdominal: negative for nausea, vomiting, diarrhea, bright red blood per rectum, melena, or hematemesis Neurologic: negative for visual changes, syncope, or dizziness All other systems reviewed and are otherwise negative except as noted above.    Blood pressure 130/80, pulse (!) 102, height 6' (1.829 m), weight 235 lb (106.6 kg).  General appearance: alert, cooperative and no distress Neck: no carotid bruit and no JVD Lungs: clear to auscultation bilaterally Heart: irregularly irregular rhythm Extremities: extremities normal, atraumatic, no cyanosis or edema Skin: Skin color, texture, turgor normal. No rashes or lesions Neurologic: Grossly normal  EKG AF with VR 100  ASSESSMENT AND PLAN:   Chronic atrial fibrillation (HCC) He remains asymptomatic  Essential hypertension Followed by his PCP   PLAN  Same Rx. We again discussed his CADs VASc score and stroke risk. When he turns 64 he will be a CHADs VASc=2. I suggested he contact us if notes any unusual  dyspnea or edema- I would check an echo at that point. F/U with Dr Antoine PocheHochrein in a year.   Corine ShelterLuke Stephani Janak PA-C 09/06/2017 11:56 AM

## 2017-09-06 NOTE — Assessment & Plan Note (Signed)
Followed by his PCP 

## 2017-09-06 NOTE — Assessment & Plan Note (Signed)
He remains asymptomatic.

## 2017-09-06 NOTE — Patient Instructions (Signed)
Medication Instructions:  Continue current medications  If you need a refill on your cardiac medications before your next appointment, please call your pharmacy.  Labwork: None Ordered   Testing/Procedures: None Ordered  Follow-Up: Your physician wants you to follow-up in: 1 Year with Dr Antoine PocheHochrein. You should receive a reminder letter in the mail two months in advance. If you do not receive a letter, please call our office [phone].    Thank you for choosing [facility]!!

## 2017-09-29 ENCOUNTER — Other Ambulatory Visit: Payer: Self-pay | Admitting: Cardiology

## 2018-08-20 ENCOUNTER — Encounter: Payer: Self-pay | Admitting: Cardiology

## 2018-09-04 NOTE — Progress Notes (Signed)
Cardiology Office Note   Date:  09/06/2018   ID:  Victor Young, DOB 11-Sep-1956, MRN [id number]  PCP:  Catha Gosselin, MD  Cardiologist:   No primary care provider on file.   Chief Complaint  Patient presents with  . Atrial Fibrillation      History of Present Illness: Victor Young is a 62 y.o. male who presents for follow up of chronic atrial fib.   He had a failed cardioversion by another cardiology group. A POET done in 2017 and was negative for ischemia. His HR was up and his beta blocker was increased then. He presents for follow up.   He was told by a [place] physician that should he should be on Xarelto.  He wanted to clarify.  This is described as below.  He feels well.  He does not feel his atrial fibrillation.  He denies any cardiovascular symptoms. The patient denies any new symptoms such as chest discomfort, neck or arm discomfort. There has been no new shortness of breath, PND or orthopnea. There have been no reported palpitations, presyncope or syncope.   Past Medical History:  Diagnosis Date  . Atrial fibrillation (HCC)   . Gout   . Hypertension     Past Surgical History:  Procedure Laterality Date  . DERMOID CYST  EXCISION    . HERNIA REPAIR    . PATELLAR TENDON REPAIR  08/05/2012   Procedure: PATELLA TENDON REPAIR;  Surgeon: Eldred Manges, MD;  Location: [facility] OR;  Service: Orthopedics;  Laterality: Right;  Right Patella Tendon Repair  . TONSILLECTOMY       Current Outpatient Medications  Medication Sig Dispense Refill  . allopurinol (ZYLOPRIM) 300 MG tablet Take 300 mg by mouth daily.    Marland Kitchen amLODipine (NORVASC) 5 MG tablet Take 5 mg by mouth daily.    Marland Kitchen aspirin EC 81 MG tablet Take 81 mg by mouth daily.    Marland Kitchen lisinopril-hydrochlorothiazide (PRINZIDE,ZESTORETIC) 20-25 MG per tablet Take 1 tablet by mouth daily.    . metoprolol succinate (TOPROL-XL) 50 MG 24 hr tablet Take 1 tablet (50 mg total) by mouth daily. Take with or immediately following a meal. 90 tablet 3   . potassium chloride (K-DUR,KLOR-CON) 10 MEQ tablet Take 10 mEq by mouth 3 (three) times daily.     No current facility-administered medications for this visit.     Allergies:   Patient has no known allergies.    ROS:  Please see the history of present illness.   Otherwise, review of systems are positive for none.   All other systems are reviewed and negative.    PHYSICAL EXAM: VS:  BP 132/90   Pulse 82   Ht 6' (1.829 m)   Wt 232 lb 6.4 oz (105.4 kg)   BMI 31.52 kg/m  , BMI Body mass index is 31.52 kg/m. GENERAL:  Well appearing NECK:  No jugular venous distention, waveform within normal limits, carotid upstroke brisk and symmetric, no bruits, no thyromegaly LUNGS:  Clear to auscultation bilaterally CHEST:  Unremark HEART:  PMI not displaced or sustained,S1 and S2 within normal limits, no S3, no clicks, no rubs, no murmurs, irregular ABD:  Flat, positive bowel sounds normal in frequency in pitch, no bruits, no rebound, no guarding, no midline pulsatile mass, no hepatomegaly, no splenomegaly EXT:  2 plus pulses throughout, no edema, no cyanosis no clubbing    EKG:  EKG is ordered today. The ekg ordered today demonstrates atrial fibrillation, rate 82,  axis within normal limits, intervals within normal limits, no acute ST-T wave changes.   Recent Labs: No results found for requested labs within last 8760 hours.    Lipid Panel No results found for: CHOL, TRIG, HDL, CHOLHDL, VLDL, LDLCALC, LDLDIRECT    Wt Readings from Last 3 Encounters:  09/06/18 232 lb 6.4 oz (105.4 kg)  09/06/17 235 lb (106.6 kg)  03/30/16 233 lb (105.7 kg)      Other studies Reviewed: Additional studies/ records that were reviewed today include: None. Review of the above records demonstrates:  Please see elsewhere in the note.     ASSESSMENT AND PLAN:  ATRIAL FIB:  Mr. Victor Young has a CHA2DS2 - VASc score of 1 with a risk of stroke of 1.3%.  We had a long discussion about this.  There is  some patient preference involved here and he would prefer after discussing risk benefits of bleeding versus embolic stroke to continue just on the aspirin.  He will see me back before he turns 64 to consider when to switch to Xarelto or other meds.  He has good rate control.  No change in therapy.   RISK REDUCTION:  I will bring the patient back for a POET (Plain Old Exercise Test).  He has no new symptoms.  No change in therapy.  OVERWEIGHT:   He gets about 6000 steps per day in his job.  He golfs.  He understands the need to manage weight with diet and exercise.  HTN:  The blood pressure is at target. No change in medications is indicated. We will continue with therapeutic lifestyle changes (TLC).    Current medicines are reviewed at length with the patient today.  The patient does not have concerns regarding medicines.  The following changes have been made:  no change  Labs/ tests ordered today include: None  Orders Placed This Encounter  Procedures  . EKG 12-Lead     Disposition:   FU with me in one year.     Signed, Rollene Rotunda, MD  09/06/2018 11:42 AM    [facility]

## 2018-09-06 ENCOUNTER — Ambulatory Visit: Payer: BLUE CROSS/BLUE SHIELD | Admitting: Cardiology

## 2018-09-06 ENCOUNTER — Encounter: Payer: Self-pay | Admitting: Cardiology

## 2018-09-06 VITALS — BP 132/90 | HR 82 | Ht 72.0 in | Wt 232.4 lb

## 2018-09-06 DIAGNOSIS — I10 Essential (primary) hypertension: Secondary | ICD-10-CM | POA: Diagnosis not present

## 2018-09-06 DIAGNOSIS — I482 Chronic atrial fibrillation, unspecified: Secondary | ICD-10-CM | POA: Diagnosis not present

## 2018-09-06 MED ORDER — METOPROLOL SUCCINATE ER 50 MG PO TB24: 50.0000 mg | ORAL_TABLET | Freq: Every day | ORAL | 3 refills | Status: DC

## 2018-09-06 NOTE — Patient Instructions (Addendum)
Medication Instructions:  DECREASE- Aspirin 81 mg daily  If you need a refill on your cardiac medications before your next appointment, please call your pharmacy.  Labwork: None Ordered  If you have labs (blood work) drawn today and your tests are completely normal, you will receive your results only by: Marland Kitchen MyChart Message (if you have MyChart) OR . A paper copy in the mail If you have any lab test that is abnormal or we need to change your treatment, we will call you to review the results.  Testing/Procedures: None Ordered  Follow-Up: You will need a follow up appointment in 1 Year.  Please call our office 2 months in [phone]) to schedule the (1 Year) appointment.  You may see  DR Antoine Poche or one of the following Advanced Practice Providers on your designated Care Team:   . Joni Reining, DNP, ANP . Rhonda Barrett, PA-C .  Marland Kitchen Corine Shelter, PA-C . Dot Lanes Kroeger, PA-C . Marjie Skiff, PA-C .  Marland Kitchen Azalee Course, PA-C . Micah Flesher, PA-C  At [facility], you and your health needs are our priority.  As part of our continuing mission to provide you with exceptional heart care, we have created designated Provider Care Teams.  These Care Teams include your primary Cardiologist (physician) and Advanced Practice Providers (APPs -  Physician Assistants and Nurse Practitioners) who all work together to provide you with the care you need, when you need it.  Thank you for choosing [facility] at [facility]!!

## 2018-11-01 ENCOUNTER — Other Ambulatory Visit: Payer: Self-pay | Admitting: Cardiology

## 2019-09-11 ENCOUNTER — Telehealth: Payer: Self-pay | Admitting: *Deleted

## 2019-09-11 NOTE — Telephone Encounter (Signed)
A message was left, re: his follow up visit. 

## 2019-10-29 ENCOUNTER — Encounter: Payer: Self-pay | Admitting: Cardiology

## 2019-11-02 ENCOUNTER — Other Ambulatory Visit: Payer: Self-pay | Admitting: Cardiology

## 2020-08-06 DEATH — deceased
